# Patient Record
Sex: Female | Born: 1985 | Race: White | Hispanic: No | Marital: Married | Smoking: Never smoker
Health system: Southern US, Community
[De-identification: ages and names within clinical notes are randomized; demographics above are authoritative.]

---

## 2005-04-03 ENCOUNTER — Emergency Department (HOSPITAL_COMMUNITY): Admission: EM | Admit: 2005-04-03 | Discharge: 2005-04-03 | Payer: Self-pay | Admitting: Family Medicine

## 2006-10-26 ENCOUNTER — Emergency Department (HOSPITAL_COMMUNITY): Admission: EM | Admit: 2006-10-26 | Discharge: 2006-10-26 | Payer: Self-pay | Admitting: Family Medicine

## 2006-12-04 ENCOUNTER — Other Ambulatory Visit: Admission: RE | Admit: 2006-12-04 | Discharge: 2006-12-04 | Payer: Self-pay | Admitting: Gynecology

## 2007-05-23 ENCOUNTER — Emergency Department (HOSPITAL_COMMUNITY): Admission: EM | Admit: 2007-05-23 | Discharge: 2007-05-23 | Payer: Self-pay | Admitting: *Deleted

## 2007-06-22 ENCOUNTER — Emergency Department (HOSPITAL_COMMUNITY): Admission: EM | Admit: 2007-06-22 | Discharge: 2007-06-22 | Payer: Self-pay | Admitting: Family Medicine

## 2007-12-30 ENCOUNTER — Ambulatory Visit: Payer: Self-pay | Admitting: Family Medicine

## 2007-12-30 ENCOUNTER — Encounter (INDEPENDENT_AMBULATORY_CARE_PROVIDER_SITE_OTHER): Payer: Self-pay | Admitting: Internal Medicine

## 2007-12-30 DIAGNOSIS — R413 Other amnesia: Secondary | ICD-10-CM

## 2008-01-22 ENCOUNTER — Ambulatory Visit: Payer: Self-pay | Admitting: Family Medicine

## 2008-01-22 DIAGNOSIS — H6120 Impacted cerumen, unspecified ear: Secondary | ICD-10-CM

## 2008-06-14 ENCOUNTER — Inpatient Hospital Stay (HOSPITAL_COMMUNITY): Admission: AD | Admit: 2008-06-14 | Discharge: 2008-06-14 | Payer: Self-pay | Admitting: Obstetrics and Gynecology

## 2008-08-06 ENCOUNTER — Ambulatory Visit: Payer: Self-pay | Admitting: Family Medicine

## 2008-08-06 DIAGNOSIS — R21 Rash and other nonspecific skin eruption: Secondary | ICD-10-CM | POA: Insufficient documentation

## 2008-08-11 ENCOUNTER — Telehealth: Payer: Self-pay | Admitting: Family Medicine

## 2008-08-11 LAB — CONVERTED CEMR LAB
Cytomegalovirus Ab-IgG: <0.91 (ref <0.91)
Varicella IgG: 1.65 — ABNORMAL HIGH

## 2008-10-06 ENCOUNTER — Inpatient Hospital Stay (HOSPITAL_COMMUNITY): Admission: AD | Admit: 2008-10-06 | Discharge: 2008-10-07 | Payer: Self-pay | Admitting: Obstetrics & Gynecology

## 2008-10-19 ENCOUNTER — Inpatient Hospital Stay (HOSPITAL_COMMUNITY): Admission: AD | Admit: 2008-10-19 | Discharge: 2008-10-24 | Payer: Self-pay | Admitting: Obstetrics and Gynecology

## 2008-12-06 ENCOUNTER — Emergency Department: Payer: Self-pay | Admitting: Unknown Physician Specialty

## 2009-01-27 IMAGING — CR DG ELBOW COMPLETE 3+V*R*
1 series · 1 of 1 positions shown · non-contrast
Comparison: none

CLINICAL DATA: Fall with right elbow and right forearm pain.
 RIGHT ELBOW ? 4 VIEW:

[view not recorded]
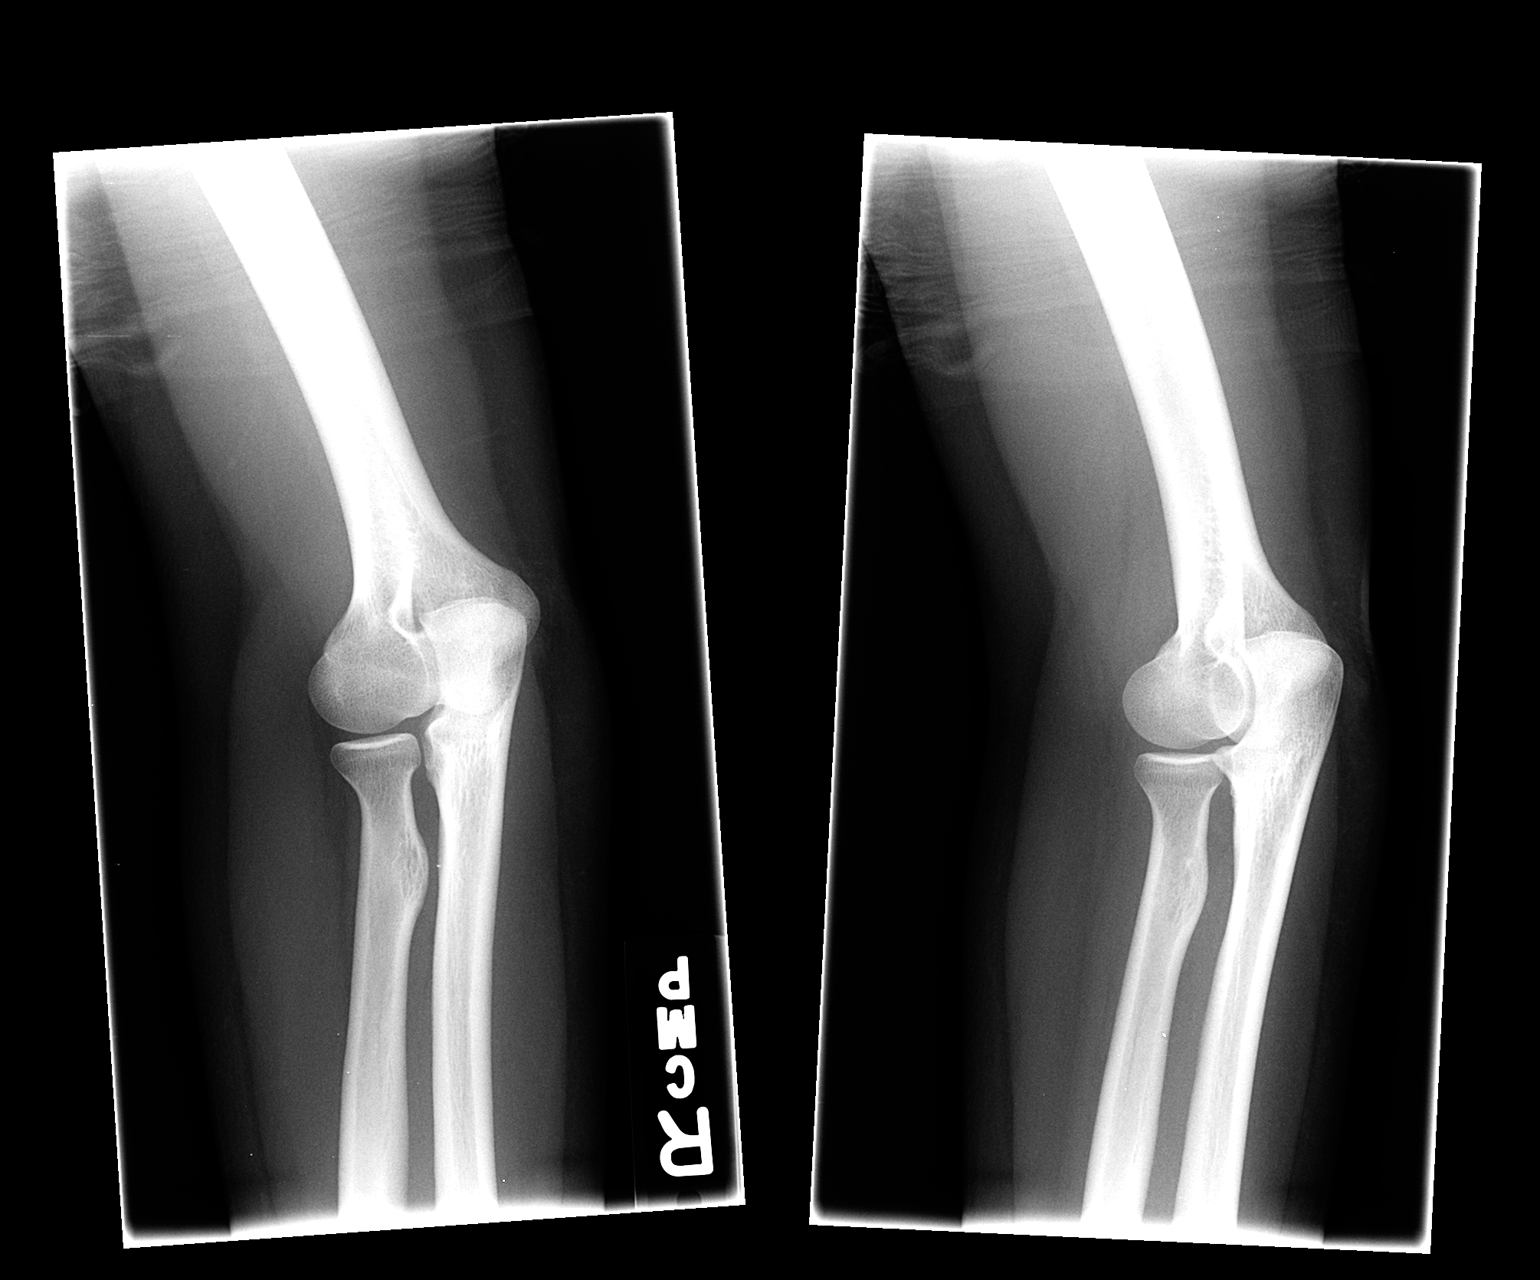

[1 of 1 positions shown; findings below may reference images not displayed]

FINDINGS: There is no evidence of fracture, dislocation, or joint effusion.  There is no evidence of arthropathy or other focal bone abnormality.  Soft tissues are unremarkable.
IMPRESSION: Negative.
 RIGHT FOREARM - 2 VIEW:
FINDINGS: There is no evidence of fracture or other focal bone lesions.  Soft tissues are unremarkable.
IMPRESSION: Negative.

## 2009-02-18 ENCOUNTER — Ambulatory Visit: Payer: Self-pay | Admitting: Family Medicine

## 2009-06-18 ENCOUNTER — Ambulatory Visit: Payer: Self-pay | Admitting: Family Medicine

## 2009-10-15 ENCOUNTER — Ambulatory Visit: Payer: Self-pay | Admitting: Internal Medicine

## 2009-10-15 DIAGNOSIS — L0201 Cutaneous abscess of face: Secondary | ICD-10-CM

## 2009-10-15 DIAGNOSIS — L03211 Cellulitis of face: Secondary | ICD-10-CM

## 2009-10-21 ENCOUNTER — Telehealth (INDEPENDENT_AMBULATORY_CARE_PROVIDER_SITE_OTHER): Payer: Self-pay | Admitting: Internal Medicine

## 2010-01-26 ENCOUNTER — Ambulatory Visit: Payer: Self-pay | Admitting: Family Medicine

## 2010-01-27 ENCOUNTER — Telehealth: Payer: Self-pay | Admitting: Family Medicine

## 2010-06-13 ENCOUNTER — Ambulatory Visit: Payer: Self-pay | Admitting: Family Medicine

## 2010-07-05 ENCOUNTER — Ambulatory Visit: Payer: Self-pay | Admitting: Family Medicine

## 2010-07-05 ENCOUNTER — Other Ambulatory Visit: Admission: RE | Admit: 2010-07-05 | Discharge: 2010-07-05 | Payer: Self-pay | Admitting: Family Medicine

## 2010-07-05 DIAGNOSIS — F329 Major depressive disorder, single episode, unspecified: Secondary | ICD-10-CM

## 2010-07-06 ENCOUNTER — Encounter: Admission: RE | Admit: 2010-07-06 | Discharge: 2010-07-06 | Payer: Self-pay | Admitting: Family Medicine

## 2010-07-08 ENCOUNTER — Telehealth: Payer: Self-pay | Admitting: Family Medicine

## 2010-07-11 ENCOUNTER — Encounter: Payer: Self-pay | Admitting: Family Medicine

## 2010-07-13 ENCOUNTER — Telehealth: Payer: Self-pay | Admitting: Family Medicine

## 2010-08-08 ENCOUNTER — Ambulatory Visit: Payer: Self-pay | Admitting: Family Medicine

## 2010-08-08 DIAGNOSIS — IMO0002 Reserved for concepts with insufficient information to code with codable children: Secondary | ICD-10-CM | POA: Insufficient documentation

## 2011-01-17 NOTE — Assessment & Plan Note (Signed)
Summary: throwing up/dlo   Vital Signs:  Patient profile:   25 year old female Weight:      135 pounds Temp:     100.2 degrees F oral Pulse rate:   104 / minute Pulse rhythm:   regular BP sitting:   120 / 70  (left arm) Cuff size:   regular  Vitals Entered By: [name] LPN (January 26, 2010 2:41 PM) CC: Has been throwing up and has diarrhea, able to tolerate liquids and jello   History of Present Illness: Pt here with Adriana Cain, nauseated and vomiting since this AM. 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 Adriana Cain was off yesterday. Adriana Cain went to [facility].  No cough, ST, rhinitis but has had scratchy throat.   Problems Prior to Update: 1)  Cellulitis, Face, Right  (ICD-682.0) 2)  Otitis Media, Acute, Right  (ICD-382.9) 3)  Viral Gastroenteritis  (ICD-008.8) 4)  Skin Rash  (ICD-782.1) 5)  Cerumen Impaction, Right  (ICD-380.4) 6)  Memory Loss  (ICD-780.93) 7)  Family Planning  (ICD-V25.09)  Medications Prior to Update: 1)  Prozac Liquid .... Pt. Not Sure of Mg. Takes One Teaspoon Daily 2)  Septra Ds 800-160 Mg Tabs (Sulfamethoxazole-Trimethoprim) .... Take 1 Two Times A Day 3)  Bactroban 2 % Crea (Mupirocin Calcium) .... Apply Two Times A Day To Skin Lesion 4)  Iud  Allergies: 1)  ! Amoxicillin  Physical Exam  General:  alert, well-developed, well-nourished, and well-hydrated, slightly dizzy with sitting up..   Head:  Normocephalic and atraumatic without obvious abnormalities. No apparent alopecia or balding. Sinuses NT. Eyes:  Conjunctiva clear bilaterally.  Ears:  R TM dull--no landmarks, not bulging, erythematous L TM dull to LR Nose:  no airflow obstruction, mucosal erythema, and mucosal edema.  sinuses +,- Mouth:  no exudates and pharyngeal erythema.   Lungs:  moist cough only, no crackles and no wheezes.   Abdomen:  Bowel sounds positive,abdomen soft and non-tender without masses, organomegaly or hernias noted.   Impression & Recommendations:  Problem  # 1:  OTITIS MEDIA, ACUTE, RIGHT (ICD-382.9) Assessment Unchanged  Recurrent, see instructuions. The following medications were removed from the medication list:    Septra Ds 800-160 Mg Tabs (Sulfamethoxazole-trimethoprim) .Marland Kitchen... Take 1 two times a day Her updated medication list for this problem includes:    Zithromax 200 Mg/66ml Susr (Azithromycin) .Marland Kitchen... As dir  Instructed on prevention and treatment. Call if no improvement in 48-72 hours or sooner if worsening symptoms.   Problem # 2:  GASTROENTERITIS (ICD-558.9) Assessment: New See instructions.  Complete Medication List: 1)  Iud  2)  Zithromax 200 Mg/88ml Susr (Azithromycin) .... As dir 3)  Promethazine Hcl 6.25 Mg/30ml Syrp (Promethazine hcl) .... One tbsp by mouth every 6 hrsw as needed nausea.  Patient Instructions: 1)  Clear liqs today. 2)  BRAT tomm. 3)  Avoid milk and milk prods for one week. 4)  Take Zithro. 5)  RTC here if sxs don't improve. Prescriptions: PROMETHAZINE HCL 6.25 MG/5ML SYRP (PROMETHAZINE HCL) one tbsp by mouth every 6 hrsw as needed nausea.  #1 bottle x 0   Entered and Authorized by:   Shaune Leeks MD   Signed by:   Shaune Leeks MD on 01/26/2010   Method used:   Electronically to        [facility] (retail)       [address]       [place]  [place], [place]  [address]       Ph: [phone]       Fax: [phone]   RxID:   [id number] 200 MG/5ML SUSR (AZITHROMYCIN) as dir  #1 bottle x 0   Entered and Authorized by:   Shaune Leeks MD   Signed by:   Shaune Leeks MD on 01/26/2010   Method used:   Electronically to        [facility] (retail)       [address]       [place], [place]  [address]       Ph: [phone]       Fax: [phone]   RxID:   [id number]   Current Allergies (reviewed today): ! AMOXICILLIN

## 2011-01-17 NOTE — Progress Notes (Signed)
Summary: reaction to generic wellbutrin  Phone Note Call from Patient Call back at Home Phone [phone]   Caller: Spouse Call For: Ruthe Mannan MD Summary of Call: Pt started generic wellbutrin yesterday.  By yesterday afternoon she was itching really badly and having bad mood swings.  Husband is asking if she can change to something else.  Uses [facility]. Initial call taken by: Lowella Petties CMA,  July 08, 2010 8:34 AM  Follow-up for Phone Call        We can restart her prozac.  Additional Follow-up for Phone Call Additional follow up Details #1::        Advised pt's husband.             Lowella Petties CMA  July 08, 2010 10:36 AM     New/Updated Medications: PROZAC 20 MG CAPS (FLUOXETINE HCL) 1 tab by mouth daily. Prescriptions: PROZAC 20 MG CAPS (FLUOXETINE HCL) 1 tab by mouth daily.  #30 x 0   Entered and Authorized by:   Ruthe Mannan MD   Signed by:   Ruthe Mannan MD on 07/08/2010   Method used:   Electronically to        [facility] (retail)       [address]       [place], [place]  [address]       Ph: [phone]       Fax: [phone]   RxID:   [id number]

## 2011-01-17 NOTE — Assessment & Plan Note (Signed)
Summary: ONE MONTH FOLLOW UP / LFW   Vital Signs:  Patient profile:   25 year old female Height:      61 inches Weight:      123.50 pounds BMI:     23.42 Temp:     98.3 degrees F oral Pulse rate:   64 / minute Pulse rhythm:   regular BP sitting:   102 / 70  (left arm) Cuff size:   regular  Vitals Entered By: Linde Gillis CMA Duncan Dull) (August 08, 2010 9:38 AM) CC: 1 month follow up   History of Present Illness: 25 yo female new to me here to follow up depression and painful intercourse. 1.  Painful intercourse- has been an issue for over two years.  Was conerned it was her IUD but we verified appropriate intrauterine position. Started her on Prozac last month, sex is now less painful.  2.  Depression- started Prozac last month.  Doing well.   Could not tolerate Wellbutrin, prefers prozac.  Mood is better.  Husband agrees that she is less tearful and more motivated to do things.  No SI or HI.  Current Medications (verified): 1)  Iud 2)  Prozac 20 Mg Caps (Fluoxetine Hcl) .Marland Kitchen.. 1 Tab By Mouth Daily. 3)  Fluoxetine Hcl 20 Mg/26ml Soln (Fluoxetine Hcl) .... 5 Ml By Mouth Daily Dispense Qs  Allergies: 1)  ! Amoxicillin  Past History:  Family History: Last updated: 12/30/2007 Father: 51--COPD, hx of ETOH abuse, stomach cancer Mother: 45--Hx of ETOH abuse,  Siblings: 1 br--L&W                1 half br--ETOH abuse                DM-MGM MI-Paunt,  CVA- MGF Prostate Cancer-0 Breast Cancer-0 Ovarian Cancer-0 Uterine Cancer-0 Colon Cancer-grand parent Drug/ ETOH Abuse-fathers side, some on mothers side Depression-   Social History: Last updated: 12/30/2007 Marital Status: Married 9/08 Children: 0 Occupation: diet aid at [employer]  Risk Factors: Exercise: no (12/30/2007)  Risk Factors: Smoking Status: never (12/30/2007) Passive Smoke Exposure: yes (12/30/2007)  Review of Systems      See HPI Psych:  Denies mental problems, panic attacks, sense of great danger,  suicidal thoughts/plans, thoughts of violence, unusual visions or sounds, and thoughts /plans of harming others.  Physical Exam  General:  Alert, well-developed, well-nourished, and well-hydrated.   Psych:  Cognition and judgment appear intact. Alert and cooperative with normal attention span and concentration. No apparent delusions, illusions, hallucinations   Impression & Recommendations:  Problem # 1:  DYSPAREUNIA (ICD-625.0) Assessment Improved Improved with Prozac.  See below.  Problem # 2:  DEPRESSION (ICD-311) Assessment: Improved Time spent with patient 25 minutes, more than 50% of this time was spent counseling patient on depression and dyspareunia.  Continue Prozac at current dose.  The following medications were removed from the medication list:    Prozac 20 Mg Caps (Fluoxetine hcl) .Marland Kitchen... 1 tab by mouth daily. Her updated medication list for this problem includes:    Fluoxetine Hcl 20 Mg/39ml Soln (Fluoxetine hcl) .[facility] KitchenMarland Kitchen 5 ml by mouth daily dispense qs  Complete Medication List: 1)  Iud  2)  Fluoxetine Hcl 20 Mg/92ml Soln (Fluoxetine hcl) .... 5 ml by mouth daily dispense qs Prescriptions: FLUOXETINE HCL 20 MG/5ML SOLN (FLUOXETINE HCL) 5 ml by mouth daily dispense qs  #1 x 11   Entered and Authorized by:   Ruthe Mannan MD   Signed by:  Ruthe Mannan MD on 08/08/2010   Method used:   Electronically to        [facility] (retail)       [address]       [place], [place]  [address]       Ph: [phone]       Fax: [phone]   RxID:   [id number]   Current Allergies (reviewed today): ! AMOXICILLIN

## 2011-01-17 NOTE — Progress Notes (Signed)
Summary: scripts need clarification  Phone Note From Pharmacy   [facility] Summary of Call: Pharmacy needs clarification on the meds that were sent in yesterday, they need the quantity on the phenergan and the directions on the zithromax. Initial call taken by: Lowella Petties CMA,  January 27, 2010 8:26 AM  Follow-up for Phone Call        10 Phenergan Zithro 2tsp to starrt then 1 tsp daily until gone. Follow-up by: Shaune Leeks MD,  January 27, 2010 8:36 AM  Additional Follow-up for Phone Call Additional follow up Details #1::        Called to [facility]. Additional Follow-up by: Lowella Petties CMA,  January 27, 2010 11:55 AM

## 2011-01-17 NOTE — Progress Notes (Signed)
Summary: wants liquid prozac  Phone Note Call from Patient Call back at Home Phone [phone]   Caller: Spouse Summary of Call: Pt is taking prozac capsules but husband is asking if she can get this in a liquid form instead.  Pt has difficulty swallowing tablets and capsules. Uses [facility]. Initial call taken by: Lowella Petties CMA,  July 13, 2010 10:10 AM  Follow-up for Phone Call        Lowella Bandy, can you please call her pharmacy to see if they have this in liquid. Thanks, Linnie Delgrande  Yes it does come in liquid form.  20mg /43ml per pharmacist at [facility].  Linde Gillis CMA Duncan Dull)  July 13, 2010 11:21 AM   Additional Follow-up for Phone Call Additional follow up Details #1::        Call in 5ml (20mg /23ml) dose daily, dispense quantity sufficient with 1 refill.  Ruthe Mannan, MD 07/13/2010 11:36AM  Rx called to pharmacy, added to medication list. Additional Follow-up by: Linde Gillis CMA Duncan Dull),  July 13, 2010 11:37 AM

## 2011-01-17 NOTE — Assessment & Plan Note (Signed)
Summary: new pt to estabh from [facility].  pt husband see's dr Dayton Martes and w...   Vital Signs:  Patient profile:   25 year old female Height:      61 inches Weight:      128.25 pounds Temp:     98.5 degrees F Pulse rate:   76 / minute Pulse rhythm:   regular BP sitting:   108 / 72  (left arm) Cuff size:   regular  Vitals Entered By: Janee Morn CMA (July 05, 2010 11:11 AM) CC: New pt establish care   History of Present Illness: 25 yo female new to me here to discuss two issues.  1.  Painful intercourse- has had IUD for two years.  Periods have been very light but over past year, feels pain internally when she has sex.  She feels like huband is hitting her IUD. Cannot remember the name of her OB who inserted it.  No fevers, nausea, vomiting.  2.  Depression/inattention- has been on Adderall and Ritilin in past.  Was recently on Prozaac and would like to start that again.  Has h/o FAS.  Overall doing well, but cannot concentrate at work.  Denies anxiety, SI or HI.  Current Medications (verified): 1)  Iud 2)  Wellbutrin 75 Mg Tabs (Bupropion Hcl) .... 2 Tabs By Mouth Every Morning  Allergies: 1)  ! Amoxicillin  Past History:  Family History: Last updated: 12/30/2007 Father: 51--COPD, hx of ETOH abuse, stomach cancer Mother: 45--Hx of ETOH abuse,  Siblings: 1 br--L&W                1 half br--ETOH abuse                DM-MGM MI-Paunt,  CVA- MGF Prostate Cancer-0 Breast Cancer-0 Ovarian Cancer-0 Uterine Cancer-0 Colon Cancer-grand parent Drug/ ETOH Abuse-fathers side, some on mothers side Depression-   Social History: Last updated: 12/30/2007 Marital Status: Married 9/08 Children: 0 Occupation: diet aid at [employer]  Risk Factors: Exercise: no (12/30/2007)  Risk Factors: Smoking Status: never (12/30/2007) Passive Smoke Exposure: yes (12/30/2007)  Review of Systems      See HPI GI:  Denies abdominal pain, nausea, and vomiting. GU:  Denies abnormal vaginal  bleeding, discharge, and dysuria.  Physical Exam  General:  Alert, well-developed, well-nourished, and well-hydrated.   Genitalia:  normal introitus, no external lesions, and no vaginal or cervical lesions.   IUD strings in place no cervical motion tenderness Extremities:  no edema Psych:  Cognition and judgment appear intact. Alert and cooperative with normal attention span and concentration. No apparent delusions, illusions, hallucinations   Impression & Recommendations:  Problem # 1:  INTRAUTERINE CONTRACEPTIVE DEVICE SURVEILLANCE (ICD-V25.42) Will get ultrasound to evaluate placement. Pap performed today as well. She had no discomfort with exam.  ?possibility of IUD pressing against uterine wall although no abdominal pain without intercourse. Orders: Radiology Referral (Radiology)  Problem # 2:  DEPRESSION (ICD-311) Assessment: Unchanged Time spent with patient 25 minutes, more than 50% of this time was spent counseling patient on depression and inattention. I explained that prozaac is not my first choice given her symptoms. I would prefer Wellbutrin to help with attention and minmize sexual dyfunction. IR tablets ordered so she can crush themt(she cannot swallow pills). Follow up in one month.  Her updated medication list for this problem includes:    Wellbutrin 75 Mg Tabs (Bupropion hcl) .Marland Kitchen... 2 tabs by mouth every morning  Complete Medication List: 1)  Iud  2)  Wellbutrin 75 Mg Tabs (Bupropion hcl) .... 2 tabs by mouth every morning  Patient Instructions: 1)  Please stop by to see Shirlee Limerick on your way out.  Prescriptions: WELLBUTRIN 75 MG TABS (BUPROPION HCL) 2 tabs by mouth every morning  #60 x 0   Entered and Authorized by:   Ruthe Mannan MD   Signed by:   Ruthe Mannan MD on 07/05/2010   Method used:   Electronically to        [facility] (retail)       [address]       [place], [place]  [address]       Ph: [phone]       Fax:  [phone]   RxID:   [id number]   Current Allergies (reviewed today): ! AMOXICILLIN

## 2011-01-17 NOTE — Letter (Signed)
Summary: Out of Work  [facility] at [facility]  [address] [place], [place] [address]   Phone: [phone]  Fax: [phone]    January 26, 2010   Employee:  Adriana Cain    To Whom It May Concern:   For Medical reasons, please excuse the above named employee from work for the following dates:  Start:   01/26/10  End:   01/28/10  If you need additional information, please feel free to contact our office.         Sincerely,    Shaune Leeks MD

## 2011-01-17 NOTE — Assessment & Plan Note (Signed)
Summary: sore throat/alc acute only   Vital Signs:  Patient profile:   25 year old female Weight:      128.50 pounds Temp:     98.8 degrees F oral Pulse rate:   84 / minute Pulse rhythm:   regular BP sitting:   94 / 60  (left arm) Cuff size:   regular  Vitals Entered By: [name] LPN (June 13, 2010 3:42 PM) CC: Non-productive cough, head congestion and sore throat   History of Present Illness: Pt here for bad ST since Sat. She has occas chills, no fever, no headache, no ear pain, mild rhinitis that is clear, signif ST as above, some cough, minimally poroductive of clear mucous, mild SOB, no N/V, no achiness. She has taken Tyl severe colde.    Problems Prior to Update: 1)  Cellulitis, Face, Right  (ICD-682.0) 2)  Skin Rash  (ICD-782.1) 3)  Cerumen Impaction, Right  (ICD-380.4) 4)  Memory Loss  (ICD-780.93) 5)  Family Planning  (ICD-V25.09)  Medications Prior to Update: 1)  Iud 2)  Zithromax 200 Mg/65ml Susr (Azithromycin) .... As Dir 3)  Promethazine Hcl 6.25 Mg/38ml Syrp (Promethazine Hcl) .... One Tbsp By Mouth Every 6 Hrsw As Needed Nausea.  Allergies: 1)  ! Amoxicillin  Physical Exam  General:  Alert, well-developed, well-nourished, and well-hydrated.   Head:  Normocephalic and atraumatic without obvious abnormalities. No apparent alopecia or balding. Sinuses NT. Eyes:  Conjunctiva clear bilaterally.  Ears:  R TM erythem and distorted, L TM ok. Nose:  External nasal examination shows no deformity or inflammation. Nasal mucosa are pink and moist without lesions or exudates. Mouth:  no exudate but mild  pharyngeal erythema w/o exudate.   Neck:  No deformities or masses but swollen ant cerv glands with  tenderness noted bilat, shoody and mobile. Chest Wall:  No deformities, masses, or tenderness noted. Lungs:  Normal respiratory effort, chest expands symmetrically. Lungs are clear to auscultation, no crackles or wheezes. Heart:  Normal rate and regular rhythm. S1  and S2 normal without gallop, murmur, click, rub or other extra sounds.   Impression & Recommendations:  Problem # 1:  OTITIS MEDIA, PURULENT, ACUTE (ICD-382.00)  Right sided, see instructions. Her updated medication list for this problem includes:    Zithromax 200 Mg/100ml Susr (Azithromycin) .Marland Kitchen... As dir    Bactrim 400-80 Mg Tabs (Sulfamethoxazole-trimethoprim)  Instructed on prevention and treatment. Call if no improvement in 48-72 hours or sooner if worsening symptoms.   Problem # 2:  URI (ICD-465.9) Assessment: New  See instructions. Her updated medication list for this problem includes:    Promethazine Hcl 6.25 Mg/103ml Syrp (Promethazine hcl) ..... One tbsp by mouth every 6 hrsw as needed nausea.  Instructed on symptomatic treatment. Call if symptoms persist or worsen.   Complete Medication List: 1)  Iud  2)  Zithromax 200 Mg/26ml Susr (Azithromycin) .... As dir 3)  Promethazine Hcl 6.25 Mg/31ml Syrp (Promethazine hcl) .... One tbsp by mouth every 6 hrsw as needed nausea. 4)  Bactrim 400-80 Mg Tabs (Sulfamethoxazole-trimethoprim)  Patient Instructions: 1)  Take Septra/Bactrim susp two times a day for 10 days. 2)  Take Guaifenesin by going to [facility] or [facility] and getting MUCOUS RELIEF EXPECTORANT (400mg ), take 11/2 tabs by mouth AM and NOON. 3)  Drink lots of fluids anytime taking Guaifenesin.  4)  Tyl as needed. 5)  Gargle every 1/2 hr with warm salt water for two days.  Current Allergies (reviewed today): !  AMOXICILLIN

## 2011-01-17 NOTE — Letter (Signed)
Summary: Results Follow up Letter  [place] at [facility]  [address] [place], [place] [address]   Phone: [phone]  Fax: [phone]    07/11/2010 MRN: [id number]  Adriana Cain [address] [place], [place]  [address]  Dear [name],  The following are the results of your recent test(s):  Test         Result    Pap Smear:        Normal __X___  Not Normal _____ Comments: ______________________________________________________ Cholesterol: LDL(Bad cholesterol):         Your goal is less than:         HDL (Good cholesterol):       Your goal is more than: Comments:  ______________________________________________________ Mammogram:        Normal _____  Not Normal _____ Comments:  ___________________________________________________________________ Hemoccult:        Normal _____  Not normal _______ Comments:    _____________________________________________________________________ Other Tests:    We routinely do not discuss normal results over the telephone.  If you desire a copy of the results, or you have any questions about this information we can discuss them at your next office visit.   Sincerely,       Ruthe Mannan, MD

## 2011-03-15 ENCOUNTER — Ambulatory Visit (INDEPENDENT_AMBULATORY_CARE_PROVIDER_SITE_OTHER): Payer: BC Managed Care – PPO | Admitting: Family Medicine

## 2011-03-15 ENCOUNTER — Encounter: Payer: Self-pay | Admitting: *Deleted

## 2011-03-15 ENCOUNTER — Encounter: Payer: Self-pay | Admitting: Family Medicine

## 2011-03-15 VITALS — BP 100/76 | HR 80 | Temp 97.7°F | Ht 61.0 in | Wt 121.8 lb

## 2011-03-15 DIAGNOSIS — K5289 Other specified noninfective gastroenteritis and colitis: Secondary | ICD-10-CM

## 2011-03-15 DIAGNOSIS — K529 Noninfective gastroenteritis and colitis, unspecified: Secondary | ICD-10-CM

## 2011-03-15 DIAGNOSIS — R110 Nausea: Secondary | ICD-10-CM

## 2011-03-15 MED ORDER — PROMETHAZINE HCL 25 MG/ML IJ SOLN: 25.0000 mg | INTRAMUSCULAR | Status: DC

## 2011-03-15 MED ORDER — PROMETHAZINE HCL 6.25 MG/5ML PO SYRP: 12.5000 mg | ORAL_SOLUTION | Freq: Four times a day (QID) | ORAL | Status: DC | PRN

## 2011-03-15 MED ORDER — PROMETHAZINE HCL 25 MG PO TABS: 25.0000 mg | ORAL_TABLET | Freq: Four times a day (QID) | ORAL | Status: DC | PRN

## 2011-03-15 NOTE — Progress Notes (Signed)
  Subjective:     Adriana Cain is a 25 y.o. female who presents for evaluation of nonbilious vomiting 4 times per day. Symptoms have been present for 1 day. Patient denies fever. Patient's oral intake has been decreased for solids. Patient's urine output has been adequate. Other contacts with similar symptoms include: husband and daughter. Patient denies recent travel history. Patient has not had recent ingestion of possible contaminated food, toxic plants, or inappropriate medications/poisons.   The following portions of the patient's history were reviewed and updated as appropriate: problem list.  Review of Systems Pertinent items are noted in HPI.    Objective:     BP 100/76  Pulse 80  Temp(Src) 97.7 F (36.5 C) (Oral)  Ht 5\' 1"  (1.549 m)  Wt 121 lb 12.8 oz (55.248 kg)  BMI 23.01 kg/m2  General Appearance:    Alert, cooperative, no distress, appears stated age, holding trash can.  Head:  Normocephalic  Throat:   Lips, mucosa, and tongue normal; teeth and gums normal  Neck:   Supple, symmetrical, trachea midline, no adenopathy;    thyroid:  no enlargement/tenderness/nodules; no carotid   bruit or JVD  Back:     Symmetric, no curvature, ROM normal, no CVA tenderness  Abdomen:     Soft, non-tender, bowel sounds active all four quadrants,    no masses, no organomegaly  Extremities:   Extremities normal, atraumatic, no cyanosis or edema  Pulses:   2+ and symmetric all extremities  Skin:   Skin color, texture, turgor normal, no rashes or lesions      Assessment:    Acute Gastroenteritis    Plan:    1. Discussed oral rehydration, reintroduction of solid foods, signs of dehydration. 2. Return or go to emergency department if worsening symptoms, blood or bile, signs of dehydration, diarrhea lasting longer than 5 days or any new concerns. 3. Follow up in 2 weeks or sooner as needed.

## 2011-03-15 NOTE — Patient Instructions (Signed)
  Place gastroenteritis patient instructions here.

## 2011-05-02 NOTE — Op Note (Signed)
NAMEMarland Kitchen  SKYE, PLAMONDON           ACCOUNT NO.:  [id number]   MEDICAL RECORD NO.:  [id number]          PATIENT TYPE:  INP   LOCATION:  9146                          FACILITY:  [facility]   PHYSICIAN:  Zelphia Cairo, MD    DATE OF BIRTH:  06/15/1986   DATE OF PROCEDURE:  10/21/2008  DATE OF DISCHARGE:                               OPERATIVE REPORT   PREOPERATIVE DIAGNOSES:  1. Intrauterine pregnancy at term.  2. Failure to progress in labor.   PROCEDURE:  Primary low-transverse cesarean section.   SURGEON:  Zelphia Cairo, MD.   ESTIMATED BLOOD LOSS:  1000 mL.   URINE OUTPUT:  100 mL.   FINDINGS:  Viable female infant with Apgars of 9 and 9, weight 7 pounds  5 ounces.   COMPLICATIONS:  None.   CONDITION:  Stable to recovery room.   PROCEDURE:  The patient was taken to the operating room where epidural  anesthesia was found to be adequate.  She was prepped and draped in  sterile fashion.  Pfannenstiel skin incision was made with a scalpel and  carried down to the underlying fascia.  The fascia was incised in the  midline.  This was extended laterally using Mayo scissors.  Kocher  clamps were used to grasp the superior portion of the fascia.  This was  tented upwards and the underlying rectus muscles were dissected off  using the Bovie.  Our attention was then turned to the inferior portion.  The fascia was tented upwards with Kocher clamps and the underlying  rectus muscles were dissected off using the Bovie.  Peritoneum was then  identified, tented upwards, and entered sharply with Metzenbaum  scissors.  This was extended superiorly and inferiorly with good  visualization of the bladder.  Bladder blade was then inserted.  Vesicouterine peritoneum was tented upwards and entered sharply with  Metzenbaum scissors.  This was dissected off of the lower uterine  segment.  Bladder blade was then reinserted to protect the bladder flap.   Uterine incision was then made with a scalpel  and this incision was  extended laterally using bandage scissors.  Fetal vertex was brought to  the uterine incision and the mouth and nose were suctioned.  Shoulders  and body easily followed.  Using fundal pressure, the cord was clamped  and cut, and the infant was taken to the awaiting pediatric staff.  The  placenta was then manually removed from the placenta and the placenta  was handed off for disposal.  Uterus was then exteriorized from pelvis.  The uterus was cleared of all clots and debris using a dry lap sponge.  Uterine incision was closed using a double layer closure of 0-chromic in  a running locked fashion.  Once hemostasis was assured, the uterus was  placed back into the pelvic cavity and the pelvis was copiously  irrigated with warm  normal saline.  The uterine incision was reinspected and found to be  hemostatic.  Peritoneum was closed with 0-Monocryl.  The fascia was  closed with a looped 0-PDS and the skin was closed with staples.  The  patient tolerated the  procedure well.  Sponge, lap, needle, and  instrument counts were correct x2.      Zelphia Cairo, MD  Electronically Signed     GA/MEDQ  D:  10/21/2008  T:  10/22/2008  Job:  [id number]

## 2011-05-05 NOTE — Discharge Summary (Signed)
NAMEMarland Kitchen  Adriana Cain, Adriana Cain           ACCOUNT NO.:  [id number]   MEDICAL RECORD NO.:  [id number]          PATIENT TYPE:  INP   LOCATION:  9129                          FACILITY:  [facility]   PHYSICIAN:  Juluis Mire, M.D.   DATE OF BIRTH:  [birth year]   DATE OF ADMISSION:  10/19/2008  DATE OF DISCHARGE:  10/24/2008                               DISCHARGE SUMMARY   ADMITTING DIAGNOSES:  1. Intrauterine pregnancy at term.  2. Spontaneous onset of labor, latent phase.   DISCHARGE DIAGNOSIS:  1. Status post low transverse cesarean section secondary to failure to      progress.  2. Viable female infant.   PROCEDURE:  Primary low transverse cesarean section.   REASON FOR ADMISSION:  Please see written H&P.   HOSPITAL COURSE:  The patient is a 25 year old white married female  primigravida, who was admitted to [facility] at 40 weeks  estimated gestational age with early latent phase of labor.  Group B  beta strep culture was noted to be negative.  On admission, vital signs  were stable.  Contractions were every 2-3 minutes.  Cervix was dilated  to 1-2 cm, vertex at a -3 station.  The patient was started on IV  Pitocin to augment her labor.  Fetal heart tones were reactive.  The  patient was eventually given epidural for her comfort.  Cervix was now 2  cm, 80% effaced, with vertex at a -2 station.  Fetal heart tones  continued to be reassuring.  Contractions were approximately every 2  minutes.  However, the patient made no further progress beyond 3 cm  completely effaced with a vertex at a -2 station.  Given the patient's  failure to progress adequately, decision was made to proceed with a  primary low transverse cesarean section.  The patient was now  transferred to the operating room, where epidural was dosed to an  adequate surgical level.  A low transverse incision was made with  delivery of a viable female infant weighing 7 pounds and 5 ounces with  Apgars of 9 at 1  minute and 9 at 5 minutes.  The patient tolerated the  procedure well and was taken to the recovery room in stable condition.  On postoperative day #1, the patient was without complaint.  Vital signs  were stable.  She was afebrile.  Abdomen was soft.  Fundus firm and  nontender.  Abdominal dressings were noted to be clean, dry, and intact.  Foley was draining with adequate amounts of urine output.  Laboratory  findings showed hemoglobin of 7.9, platelet count of 165,000, and WBC  count of 10.4.  CBC was ordered for the following morning.  On  postoperative day #2, the patient was without complaint.  She denied  dizziness.  She did complain of some fatigue.  Vital signs were stable.  She was afebrile.  Blood pressure was 106/72 and heart rate was 99-116.  Abdomen was soft.  Fundus firm and nontender.  Abdominal dressing noted  to be clean, dry, and intact.  Laboratory findings revealed a hemoglobin  of 7.3, platelet count of 180,000,  and WBC count of 11.7.  The patient  had been started on some iron supplementation.  She was allowed to  shower.  Abdominal dressing was removed.  On postoperative day #3, the  patient was without complaint.  Vital signs remained stable.  She was  afebrile.  Fundus firm and nontender.  Incision was clean, dry, and  intact.  Staples were removed and the patient was later discharged to  home.   CONDITION ON DISCHARGE:  Stable.   DIET:  Regular as tolerated.   ACTIVITY:  No heavy lifting, no driving x2 weeks, and no vaginal entry.   FOLLOWUP:  The patient is to follow up in the office in 1 week for an  incision check.  She is to call for temperature greater than 100  degrees, persistent nausea, vomiting, heavy vaginal bleeding, and/or  redness or drainage from incisional site.   DISCHARGE MEDICATIONS:  1. Tylox #30 one p.o. every 4-6 hours.  2. Ferrous sulfate 325 mg 1 p.o. b.i.d.  3. Prenatal vitamins 1 p.o. daily.  4. Colace 1 p.o. daily  p.r.n.      Adriana Cain, N.P.      Juluis Mire, M.D.  Electronically Signed    CC/MEDQ  D:  11/24/2008  T:  11/24/2008  Job:  [id number]

## 2011-06-03 ENCOUNTER — Encounter: Payer: Self-pay | Admitting: Family Medicine

## 2011-06-03 ENCOUNTER — Ambulatory Visit (INDEPENDENT_AMBULATORY_CARE_PROVIDER_SITE_OTHER): Payer: BC Managed Care – PPO | Admitting: Family Medicine

## 2011-06-03 VITALS — BP 100/60 | HR 80 | Temp 99.0°F | Wt 122.0 lb

## 2011-06-03 DIAGNOSIS — H6690 Otitis media, unspecified, unspecified ear: Secondary | ICD-10-CM

## 2011-06-03 DIAGNOSIS — H6691 Otitis media, unspecified, right ear: Secondary | ICD-10-CM | POA: Insufficient documentation

## 2011-06-03 MED ORDER — AZITHROMYCIN 100 MG/5ML PO SUSR: ORAL | Status: DC

## 2011-06-03 NOTE — Assessment & Plan Note (Signed)
With viral uri  Pt cannot swallow pills- px zithromax liquid  Update if not imp or worse Disc sympt care

## 2011-06-03 NOTE — Patient Instructions (Signed)
You have a cold and an ear infection on the right  Tylenol / motrin for fever (or day quil because that has tylenol in it )  Drink lots of fluids Take the zithromax as directed  Out of work until Monday

## 2011-06-03 NOTE — Progress Notes (Signed)
Subjective:    Patient ID: Adriana Cain, female    DOB: Oct 07, 1986, 25 y.o.   MRN: [id number]  HPI Started getting sick 3 days ago  Out of work  Started as a sore throat and runny/ stuffy nose  Then cough  Pressure feeling in ears  Then achiness  99 - low grade temp on and off Taking dayquil - during the day  Nothing at night  Is able to sleep  Cough is really dry   No sick exposures known  Not a smoker  No asthma   Patient Active Problem List  Diagnoses  . DEPRESSION  . CERUMEN IMPACTION, RIGHT  . DYSPAREUNIA  . CELLULITIS, FACE, RIGHT  . MEMORY LOSS  . SKIN RASH  . Right otitis media   No past medical history on file. No past surgical history on file. History  Substance Use Topics  . Smoking status: Never Smoker   . Smokeless tobacco: Not on file  . Alcohol Use: Not on file   Family History  Problem Relation Age of Onset  . Alcohol abuse Mother   . Alcohol abuse Father   . Cancer Father     stomach  . COPD Father   . Diabetes Maternal Grandmother   . Alcohol abuse Brother    Allergies  Allergen Reactions  . Amoxicillin     REACTION: as child   Current Outpatient Prescriptions on File Prior to Visit  Medication Sig Dispense Refill  . FLUoxetine (PROZAC) 20 MG/5ML solution Take 5 mg by mouth daily.        Marland Kitchen levonorgestrel (MIRENA) 20 MCG/24HR IUD 1 each by Intrauterine route once.         Current Facility-Administered Medications on File Prior to Visit  Medication Dose Route Frequency Provider Last Rate Last Dose  . promethazine (PHENERGAN) injection 25 mg  25 mg Intramuscular 1 dose over 24 hours Ruthe Mannan, MD         Review of Systems Review of Systems  Constitutional: Negative for fever, appetite change,  and unexpected weight change. pos for fatigue  Eyes: Negative for pain and visual disturbance.  ENT pos for congestion and st and ear discomfort Respiratory: pos for mild cough   Cardiovascular: Negative.for cp   Gastrointestinal:  Negative for nausea, diarrhea and constipation.  Genitourinary: Negative for urgency and frequency.  Skin: Negative for pallor. and rash Neurological: Negative for weakness, light-headedness, numbness and headaches.  Hematological: Negative for adenopathy. Does not bruise/bleed easily.  Psychiatric/Behavioral: Negative for dysphoric mood. The patient is not nervous/anxious.          Objective:   Physical Exam  Constitutional: She appears well-developed and well-nourished.  HENT:  Head: Normocephalic and atraumatic.  Left Ear: External ear normal.  Mouth/Throat: Oropharynx is clear and moist.       Nares cong and injected  R TM erythema with effusion- no bulging  Eyes: Conjunctivae and EOM are normal. Pupils are equal, round, and reactive to light. Right eye exhibits no discharge. Left eye exhibits no discharge.  Neck: Normal range of motion. Neck supple. No thyromegaly present.  Cardiovascular: Normal rate, regular rhythm and normal heart sounds.   Pulmonary/Chest: Effort normal and breath sounds normal. No respiratory distress. She has no wheezes. She has no rales.  Lymphadenopathy:    She has no cervical adenopathy.  Skin: Skin is warm and dry. No rash noted.  Psychiatric: She has a normal mood and affect.  Assessment & Plan:

## 2011-08-17 ENCOUNTER — Encounter: Payer: Self-pay | Admitting: *Deleted

## 2011-08-17 ENCOUNTER — Encounter: Payer: Self-pay | Admitting: Family Medicine

## 2011-08-17 ENCOUNTER — Ambulatory Visit (INDEPENDENT_AMBULATORY_CARE_PROVIDER_SITE_OTHER): Payer: BC Managed Care – PPO | Admitting: Family Medicine

## 2011-08-17 VITALS — BP 102/60 | HR 90 | Temp 98.5°F | Wt 118.0 lb

## 2011-08-17 DIAGNOSIS — J069 Acute upper respiratory infection, unspecified: Secondary | ICD-10-CM

## 2011-08-17 DIAGNOSIS — J029 Acute pharyngitis, unspecified: Secondary | ICD-10-CM

## 2011-08-17 NOTE — Progress Notes (Signed)
SUBJECTIVE:  Adriana Cain is a 25 y.o. female who complains of congestion, sore throat and dry cough for 2 days. She denies a history of anorexia, chest pain, nausea and shortness of breath and denies a history of asthma. Patient denies smoke cigarettes.   OBJECTIVE: BP 102/60  Pulse 90  Temp(Src) 98.5 F (36.9 C) (Oral)  Wt 118 lb (53.524 kg)  She appears well, vital signs are as noted. Ears normal.  Throat and pharynx normal.  Neck supple. No adenopathy in the neck. Nose is congested. Sinuses non tender. The chest is clear, without wheezes or rales.  ASSESSMENT:  viral upper respiratory illness  PLAN: Symptomatic therapy suggested: push fluids, rest and return office visit prn if symptoms persist or worsen. Lack of antibiotic effectiveness discussed with her. Call or return to clinic prn if these symptoms worsen or fail to improve as anticipated.

## 2011-09-19 LAB — CBC
HCT: 21.1 — ABNORMAL LOW
HCT: 23.1 — ABNORMAL LOW
Hemoglobin: 11.7 — ABNORMAL LOW
Hemoglobin: 7.3 — CL
MCHC: 33.9
MCHC: 34
MCHC: 34.4
MCV: 86.1
MCV: 86.8
Platelets: 180
WBC: 10.4
WBC: 11.7 — ABNORMAL HIGH

## 2011-10-03 ENCOUNTER — Other Ambulatory Visit: Payer: Self-pay | Admitting: Family Medicine

## 2011-10-27 ENCOUNTER — Encounter: Payer: Self-pay | Admitting: Family Medicine

## 2011-10-27 ENCOUNTER — Encounter: Payer: Self-pay | Admitting: *Deleted

## 2011-10-27 ENCOUNTER — Ambulatory Visit (INDEPENDENT_AMBULATORY_CARE_PROVIDER_SITE_OTHER): Payer: BC Managed Care – PPO | Admitting: Family Medicine

## 2011-10-27 VITALS — BP 100/70 | HR 88 | Temp 98.6°F | Ht 61.0 in | Wt 121.2 lb

## 2011-10-27 DIAGNOSIS — L0201 Cutaneous abscess of face: Secondary | ICD-10-CM

## 2011-10-27 DIAGNOSIS — L03211 Cellulitis of face: Secondary | ICD-10-CM

## 2011-10-27 MED ORDER — TRIAMCINOLONE ACETONIDE 0.025 % EX OINT: TOPICAL_OINTMENT | Freq: Two times a day (BID) | CUTANEOUS | Status: AC

## 2011-10-27 MED ORDER — SULFAMETHOXAZOLE-TRIMETHOPRIM 200-40 MG/5ML PO SUSP: 20.0000 mL | Freq: Two times a day (BID) | ORAL | Status: DC

## 2011-10-27 NOTE — Patient Instructions (Signed)
Good to see you. You can apply the triamcinolone (steroid cream) to the area twice daily (for no longer than 2 weeks). Take antibiotic as directed. Call me on Monday if no improvement.

## 2011-10-27 NOTE — Progress Notes (Signed)
  Subjective:    Patient ID: Adriana Cain, female    DOB: Oct 15, 1986, 25 y.o.   MRN: [id number]  HPI  25 yo here for ?cellulitis under her left eye.  Thinks she scratched her face. Works in a nursing home.  Last few days, increased erythema and warmth around it. Not draining but it is itchy.  Does have h/o cellulitis in past (unknown if MRSA).  No fevers, nausea or vomiting.  No conjunctival injection, blurred vision or eye irritation.  +amoxicillin allergic.  No exposure to poison ivy.  Patient Active Problem List  Diagnoses  . DEPRESSION  . CERUMEN IMPACTION, RIGHT  . DYSPAREUNIA  . CELLULITIS, FACE, RIGHT  . MEMORY LOSS  . SKIN RASH  . Right otitis media  . Cellulitis, face   No past medical history on file. No past surgical history on file. History  Substance Use Topics  . Smoking status: Never Smoker   . Smokeless tobacco: Not on file  . Alcohol Use: Not on file   Family History  Problem Relation Age of Onset  . Alcohol abuse Mother   . Alcohol abuse Father   . Cancer Father     stomach  . COPD Father   . Diabetes Maternal Grandmother   . Alcohol abuse Brother    Allergies  Allergen Reactions  . Amoxicillin     REACTION: as child   Current Outpatient Prescriptions on File Prior to Visit  Medication Sig Dispense Refill  . FLUoxetine (PROZAC) 20 MG/5ML solution TAKE ONE TEASPOONFUL  BY MOUTH EVERY DAY  120 mL  10  . levonorgestrel (MIRENA) 20 MCG/24HR IUD 1 each by Intrauterine route once.         Current Facility-Administered Medications on File Prior to Visit  Medication Dose Route Frequency Provider Last Rate Last Dose  . promethazine (PHENERGAN) injection 25 mg  25 mg Intramuscular 1 day or 1 dose Ruthe Mannan, MD       The PMH, PSH, Social History, Family History, Medications, and allergies have been reviewed in [facility], and have been updated if relevant.   Review of Systems See HPI    Objective:   Physical Exam BP 100/70  Pulse 88   Temp(Src) 98.6 F (37 C) (Oral)  Ht 5\' 1"  (1.549 m)  Wt 121 lb 4 oz (54.999 kg)  BMI 22.91 kg/m2  General:  Well-developed,well-nourished,in no acute distress; alert,appropriate and cooperative throughout examination Head:  normocephalic and atraumatic.   Ears:  R ear normal and L ear normal.   Nose:  no external deformity.   Mouth:  good dentition.   Neurologic:  alert & oriented X3 and gait normal.   Skin:  Pos linear raised erythematous area under left eye with surrounding erythema and wamrth, no drainage. Psych:  Cognition and judgment appear intact. Alert and cooperative with normal attention span and concentration. No apparent delusions, illusions, hallucinations    Assessment & Plan:   1. Cellulitis, face    New.  Looks more consistent with poison ivy with super infection vs cellulitis alone. Will start topical triamcinolone. Also septra twice daily (supsension as she cannot swallow pills). The patient indicates understanding of these issues and agrees with the plan.

## 2011-11-07 ENCOUNTER — Encounter: Payer: Self-pay | Admitting: Family Medicine

## 2011-11-07 ENCOUNTER — Ambulatory Visit (INDEPENDENT_AMBULATORY_CARE_PROVIDER_SITE_OTHER): Payer: BC Managed Care – PPO | Admitting: Family Medicine

## 2011-11-07 ENCOUNTER — Telehealth: Payer: Self-pay | Admitting: *Deleted

## 2011-11-07 VITALS — BP 102/76 | HR 79 | Temp 97.7°F | Ht 61.0 in | Wt 120.2 lb

## 2011-11-07 DIAGNOSIS — R300 Dysuria: Secondary | ICD-10-CM

## 2011-11-07 DIAGNOSIS — N390 Urinary tract infection, site not specified: Secondary | ICD-10-CM

## 2011-11-07 LAB — POCT URINALYSIS DIPSTICK
Glucose, UA: NEGATIVE
Nitrite, UA: NEGATIVE
Protein, UA: 30
Spec Grav, UA: 1.02
Urobilinogen, UA: 0.2
pH, UA: 7

## 2011-11-07 MED ORDER — CIPROFLOXACIN 500 MG/5ML (10%) PO SUSR: 250.0000 mg | Freq: Two times a day (BID) | ORAL | Status: DC

## 2011-11-07 MED ORDER — SULFAMETHOXAZOLE-TRIMETHOPRIM 200-40 MG/5ML PO SUSP: 20.0000 mL | Freq: Two times a day (BID) | ORAL | Status: AC

## 2011-11-07 NOTE — Telephone Encounter (Signed)
Ok will send in different rx. Rx for Bactrim sent in.

## 2011-11-07 NOTE — Progress Notes (Signed)
SUBJECTIVE: Adriana Cain is a 25 y.o. female who complains of urinary frequency, urgency and dysuria x 3 days, without flank pain, fever, chills, or abnormal vaginal discharge or bleeding.   Patient Active Problem List  Diagnoses  . DEPRESSION  . CERUMEN IMPACTION, RIGHT  . DYSPAREUNIA  . CELLULITIS, FACE, RIGHT  . MEMORY LOSS  . SKIN RASH  . Right otitis media  . Cellulitis, face   No past medical history on file. No past surgical history on file. History  Substance Use Topics  . Smoking status: Never Smoker   . Smokeless tobacco: Not on file  . Alcohol Use: Not on file   Family History  Problem Relation Age of Onset  . Alcohol abuse Mother   . Alcohol abuse Father   . Cancer Father     stomach  . COPD Father   . Diabetes Maternal Grandmother   . Alcohol abuse Brother    Allergies  Allergen Reactions  . Amoxicillin     REACTION: as child   Current Outpatient Prescriptions on File Prior to Visit  Medication Sig Dispense Refill  . FLUoxetine (PROZAC) 20 MG/5ML solution TAKE ONE TEASPOONFUL  BY MOUTH EVERY DAY  120 mL  10  . levonorgestrel (MIRENA) 20 MCG/24HR IUD 1 each by Intrauterine route once.        . triamcinolone (KENALOG) 0.025 % ointment Apply topically 2 (two) times daily.  30 g  0  . sulfamethoxazole-trimethoprim (BACTRIM,SEPTRA) 200-40 MG/5ML suspension Take 20 mLs by mouth 2 (two) times daily.  200 mL  0   Current Facility-Administered Medications on File Prior to Visit  Medication Dose Route Frequency Provider Last Rate Last Dose  . promethazine (PHENERGAN) injection 25 mg  25 mg Intramuscular 1 day or 1 dose Ruthe Mannan, MD       The PMH, PSH, Social History, Family History, Medications, and allergies have been reviewed in [facility], and have been updated if relevant.  OBJECTIVE:  BP 102/76  Pulse 79  Temp(Src) 97.7 F (36.5 C) (Oral)  Ht 5\' 1"  (1.549 m)  Wt 120 lb 4 oz (54.545 kg)  BMI 22.72 kg/m2 Appears well, in no apparent distress.  Vital  signs are normal. The abdomen is soft without tenderness, guarding, mass, rebound or organomegaly. No CVA tenderness or inguinal adenopathy noted. Urine dipstick shows positive for RBC's and positive for protein.    ASSESSMENT: UTI uncomplicated without evidence of pyelonephritis  PLAN: Treatment per orders - cipro 250 mg twice daily for 5 days, also push fluids, may use Pyridium OTC prn. Call or return to clinic prn if these symptoms worsen or fail to improve as anticipated. Marland Kitchen

## 2011-11-07 NOTE — Patient Instructions (Signed)
Take cipro as directed. You can buy Azo over the counter to help with burning. Have a great Thanksgiving.

## 2011-11-07 NOTE — Telephone Encounter (Signed)
Pharmacy needs clarification on cipro rx, they did not pull up cipro suspension in their database only tabs and IV solution. They need to know if it was something you wanted compounded (if so needs to be called to a compounding pharm)or if different  rx can be called in.

## 2011-11-07 NOTE — Telephone Encounter (Signed)
Pharmacist at [facility] notified via telephone of the medication change.

## 2011-11-10 ENCOUNTER — Other Ambulatory Visit: Payer: Self-pay | Admitting: Family Medicine

## 2011-11-10 LAB — URINE CULTURE

## 2011-11-10 MED ORDER — NITROFURANTOIN 25 MG/5ML PO SUSP: 100.0000 mg | Freq: Two times a day (BID) | ORAL | Status: AC

## 2011-12-20 ENCOUNTER — Telehealth: Payer: Self-pay | Admitting: Family Medicine

## 2011-12-20 NOTE — Telephone Encounter (Signed)
Spoke to patients spouse and he stated that he is going to take his wife to an Urgent Care because she can't go back to work without a Chiropractor note.  Offered him an appt for his wife with Dr. Dayton Martes on 12/21/2011 at 10:15 but he refused.  Advised him to call back and let us know how her visit goes.

## 2011-12-20 NOTE — Telephone Encounter (Signed)
Pt has had symptoms for 2-3 days and work has sent her home due to fever.  Pt has appt for Friday; however, husband says she needs to be seen sooner.  Please call back at [phone].

## 2011-12-22 ENCOUNTER — Ambulatory Visit: Payer: BC Managed Care – PPO | Admitting: Family Medicine

## 2011-12-22 DIAGNOSIS — Z0289 Encounter for other administrative examinations: Secondary | ICD-10-CM

## 2013-02-10 ENCOUNTER — Encounter: Payer: Self-pay | Admitting: Family Medicine

## 2013-02-10 ENCOUNTER — Ambulatory Visit (INDEPENDENT_AMBULATORY_CARE_PROVIDER_SITE_OTHER): Payer: BC Managed Care – PPO | Admitting: Family Medicine

## 2013-02-10 ENCOUNTER — Encounter: Payer: Self-pay | Admitting: *Deleted

## 2013-02-10 VITALS — BP 112/74 | HR 104 | Temp 98.2°F | Wt 124.8 lb

## 2013-02-10 DIAGNOSIS — H9390 Unspecified disorder of ear, unspecified ear: Secondary | ICD-10-CM

## 2013-02-10 DIAGNOSIS — J069 Acute upper respiratory infection, unspecified: Secondary | ICD-10-CM | POA: Insufficient documentation

## 2013-02-10 DIAGNOSIS — H9391 Unspecified disorder of right ear: Secondary | ICD-10-CM

## 2013-02-10 DIAGNOSIS — Z01118 Encounter for examination of ears and hearing with other abnormal findings: Secondary | ICD-10-CM | POA: Insufficient documentation

## 2013-02-10 MED ORDER — GUAIFENESIN-CODEINE 100-10 MG/5ML PO SYRP: 5.0000 mL | ORAL_SOLUTION | Freq: Every evening | ORAL | Status: DC | PRN

## 2013-02-10 NOTE — Progress Notes (Addendum)
Subjective:    Patient ID: Adriana Cain, female    DOB: 1986-03-18, 27 y.o.   MRN: [id number]  HPI CC: cough ,ST  4 d h/o ST and cough.  Dry cough.  Cough keeping her up some at night.  Mild RN and PNdrainage.  Hasn't tried any OTC med for this.  Drinking OJ and water, tea at work.  No fevers/chills, abd pain, nausea, ear or tooth pain, HA, SOB/wheezing.  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 No sick contacts at home. No smokers at home. No h/o asthma.  H/o childhood ear infections, with h/o ear tubes as infant.  Has had AOM in last few years as well.  Medications and allergies reviewed and updated in chart.  Past histories reviewed and updated if relevant as below. Patient Active Problem List  Diagnosis  . DEPRESSION  . CERUMEN IMPACTION, RIGHT  . DYSPAREUNIA  . CELLULITIS, FACE, RIGHT  . MEMORY LOSS  . SKIN RASH  . Right otitis media  . Cellulitis, face  . Viral URI with cough  . Abnormal ear exam   History reviewed. No pertinent past medical history. History reviewed. No pertinent past surgical history. History  Substance Use Topics  . Smoking status: Never Smoker   . Smokeless tobacco: Never Used  . Alcohol Use: No   Family History  Problem Relation Age of Onset  . Alcohol abuse Mother   . Alcohol abuse Father   . Cancer Father     stomach  . COPD Father   . Diabetes Maternal Grandmother   . Alcohol abuse Brother    Allergies  Allergen Reactions  . Amoxicillin     REACTION: as child   Current Outpatient Prescriptions on File Prior to Visit  Medication Sig Dispense Refill  . levonorgestrel (MIRENA) 20 MCG/24HR IUD 1 each by Intrauterine route once.        Marland Kitchen FLUoxetine (PROZAC) 20 MG/5ML solution TAKE ONE TEASPOONFUL  BY MOUTH EVERY DAY  120 mL  10  . [DISCONTINUED] promethazine (PHENERGAN) 6.25 MG/5ML syrup Take 10 mLs (12.5 mg total) by mouth 4 (four) times daily as needed for nausea.  120 mL  0   No current facility-administered medications on  file prior to visit.     Review of Systems Per HPI    Objective:   Physical Exam  Nursing note and vitals reviewed. Constitutional: She appears well-developed and well-nourished. No distress.  HENT:  Head: Normocephalic and atraumatic.  Right Ear: Hearing, external ear and ear canal normal.  Left Ear: Hearing, tympanic membrane, external ear and ear canal normal.  Nose: Nose normal. No mucosal edema or rhinorrhea. Right sinus exhibits no maxillary sinus tenderness and no frontal sinus tenderness. Left sinus exhibits no maxillary sinus tenderness and no frontal sinus tenderness.  Mouth/Throat: Uvula is midline and mucous membranes are normal. Posterior oropharyngeal edema and posterior oropharyngeal erythema present. No oropharyngeal exudate or tonsillar abscesses.  R TM abnomal - what seem like bullae vs swelling behind TM. No significant erythema or pain.  Eyes: Conjunctivae and EOM are normal. Pupils are equal, round, and reactive to light. No scleral icterus.  Neck: Normal range of motion. Neck supple.  Cardiovascular: Normal rate, regular rhythm, normal heart sounds and intact distal pulses.   No murmur heard. Pulmonary/Chest: Effort normal and breath sounds normal. No respiratory distress. She has no wheezes. She has no rales.  Lymphadenopathy:    She has no cervical adenopathy.  Skin: Skin is warm and dry. No rash  noted.       Assessment & Plan:

## 2013-02-10 NOTE — Patient Instructions (Addendum)
Sounds like you have a viral upper respiratory infection. Antibiotics are not needed for this.  Viral infections usually take 7-10 days to resolve.  The cough can last several weeks to go away. Use medication as prescribed: codeine cough syrup. Push fluids and plenty of rest. May use ibuprofen 400-600mg  at a time 2-3 times daily with food for throat pain. Suck on popsicles or herbal teas for symptomatic throat relief. Please return if you are not improving as expected, or if you have high fevers (>101.5) or difficulty swallowing or worsening productive cough. Call clinic with questions.  Good to see you today.  For the R ear drum - I'd like you to return in 1 month for recheck R ear.

## 2013-02-10 NOTE — Assessment & Plan Note (Signed)
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 I asked Gianna to return in 1 month to recheck ear - if persistent changes, will refer to ENT - discussed this.

## 2013-02-10 NOTE — Assessment & Plan Note (Signed)
And with pharyngitis. Treat supportively as per instructions. Update if not improving. Out of work note provided. cheratussin for cough at night.

## 2013-07-14 ENCOUNTER — Telehealth: Payer: Self-pay | Admitting: Family Medicine

## 2013-07-14 DIAGNOSIS — Z0279 Encounter for issue of other medical certificate: Secondary | ICD-10-CM

## 2013-07-14 NOTE — Telephone Encounter (Signed)
Advised patient letter is ready for pick up

## 2013-07-14 NOTE — Telephone Encounter (Signed)
Form is on your desk.

## 2013-07-14 NOTE — Telephone Encounter (Signed)
Left message asking mother to call me back

## 2013-07-14 NOTE — Telephone Encounter (Signed)
Letter written

## 2013-07-14 NOTE — Telephone Encounter (Signed)
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

## 2013-07-14 NOTE — Telephone Encounter (Signed)
Tressie Ellis dropped off and excusal form for jury duty to be completed by Dr. Dayton Martes. Her mother, Lonny Prude, can pick it up when completed. The best contact number for Gavin Pound is [phone].  Thanks, Revonda Standard

## 2013-07-14 NOTE — Telephone Encounter (Signed)
What is the reason she cannot perform jury duty?

## 2014-01-27 ENCOUNTER — Encounter: Payer: Self-pay | Admitting: Family Medicine

## 2014-01-27 ENCOUNTER — Ambulatory Visit (INDEPENDENT_AMBULATORY_CARE_PROVIDER_SITE_OTHER): Payer: BC Managed Care – PPO | Admitting: Family Medicine

## 2014-01-27 VITALS — BP 98/56 | HR 111 | Temp 98.1°F | Ht 59.75 in | Wt 111.5 lb

## 2014-01-27 DIAGNOSIS — J069 Acute upper respiratory infection, unspecified: Secondary | ICD-10-CM

## 2014-01-27 MED ORDER — AZITHROMYCIN 200 MG/5ML PO SUSR: ORAL | Status: DC

## 2014-01-27 NOTE — Progress Notes (Signed)
Pre-visit discussion using our clinic review tool. No additional management support is needed unless otherwise documented below in the visit note.  

## 2014-01-27 NOTE — Patient Instructions (Signed)
Good to see you. Take Azithromycin as directed. Delsym as needed for cough.

## 2014-01-27 NOTE — Progress Notes (Signed)
SUBJECTIVE:  Adriana Cain is a 28 y.o. female who complains of coryza, congestion, sneezing, productive cough and bilateral sinus pain for 8 days. She denies a history of anorexia, chest pain, chills, dizziness, fevers, myalgias and shortness of breath and denies a history of asthma. Patient denies smoke cigarettes.   Patient Active Problem List   Diagnosis Date Noted  . Viral URI with cough 02/10/2013  . Abnormal ear exam 02/10/2013  . Cellulitis, face 10/27/2011  . Right otitis media 06/03/2011  . DYSPAREUNIA 08/08/2010  . DEPRESSION 07/05/2010  . CELLULITIS, FACE, RIGHT 10/15/2009  . SKIN RASH 08/06/2008  . CERUMEN IMPACTION, RIGHT 01/22/2008  . 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 No past surgical history on file. History  Substance Use Topics  . Smoking status: Never Smoker   . Smokeless tobacco: Never Used  . Alcohol Use: No   Family History  Problem Relation Age of Onset  . Alcohol abuse Mother   . Alcohol abuse Father   . Cancer Father     stomach  . COPD Father   . Diabetes Maternal Grandmother   . Alcohol abuse Brother    Allergies  Allergen Reactions  . Amoxicillin     REACTION: as child   Current Outpatient Prescriptions on File Prior to Visit  Medication Sig Dispense Refill  . FLUoxetine (PROZAC) 20 MG/5ML solution TAKE ONE TEASPOONFUL  BY MOUTH EVERY DAY  120 mL  10  . guaiFENesin-codeine (ROBITUSSIN AC) 100-10 MG/5ML syrup Take 5 mLs by mouth at bedtime as needed for cough.  180 mL  0  . levonorgestrel (MIRENA) 20 MCG/24HR IUD 1 each by Intrauterine route once.        . [DISCONTINUED] promethazine (PHENERGAN) 6.25 MG/5ML syrup Take 10 mLs (12.5 mg total) by mouth 4 (four) times daily as needed for nausea.  120 mL  0   No current facility-administered medications on file prior to visit.   The PMH, PSH, Social History, Family History, Medications, and allergies [facility], and have been updated if  relevant.  OBJECTIVE: BP 98/56  Pulse 111  Temp(Src) 98.1 F (36.7 C) (Oral)  Ht 4' 11.75" (1.518 m)  Wt 111 lb 8 oz (50.576 kg)  BMI 21.95 kg/m2  SpO2 96%  She appears well, vital signs are as noted. Ears normal.  Throat and pharynx normal.  Neck supple. No adenopathy in the neck. Nose is congested. Sinusestender. The chest is clear, without wheezes or rales.  ASSESSMENT:  sinusitis  PLAN: Given duration and progression of symptoms, will treat for bacterial sinusitis with azithromycin (PCN allergic and cannot swallow pills).  Symptomatic therapy suggested: push fluids, rest and return office visit prn if symptoms persist or worsen.  Call or return to clinic prn if these symptoms worsen or fail to improve as anticipated.

## 2014-11-24 ENCOUNTER — Ambulatory Visit (INDEPENDENT_AMBULATORY_CARE_PROVIDER_SITE_OTHER): Payer: BC Managed Care – PPO | Admitting: Physician Assistant

## 2014-11-24 ENCOUNTER — Encounter: Payer: Self-pay | Admitting: Physician Assistant

## 2014-11-24 VITALS — BP 107/71 | HR 85 | Temp 97.9°F | Wt 112.0 lb

## 2014-11-24 DIAGNOSIS — J069 Acute upper respiratory infection, unspecified: Secondary | ICD-10-CM

## 2014-11-24 DIAGNOSIS — J020 Streptococcal pharyngitis: Secondary | ICD-10-CM

## 2014-11-24 LAB — POCT RAPID STREP A (OFFICE): Rapid Strep A Screen: POSITIVE — AB

## 2014-11-24 MED ORDER — CEFDINIR 250 MG/5ML PO SUSR: 300.0000 mg | Freq: Two times a day (BID) | ORAL | Status: DC

## 2014-11-24 MED ORDER — FLUTICASONE PROPIONATE 50 MCG/ACT NA SUSP: 2.0000 | Freq: Every day | NASAL | Status: DC

## 2014-11-24 MED ORDER — CEFDINIR 300 MG PO CAPS: 300.0000 mg | ORAL_CAPSULE | Freq: Two times a day (BID) | ORAL | Status: DC

## 2014-11-24 NOTE — Patient Instructions (Signed)
Increase fluid intake.  Rest.  Take antibiotic as directed with food. Salt-water gargles will help with throat. Place a humidifier in the bedroom.  Use Delsym or Mucinex-D for cough. Use Flonase daily as directed.  Strep Throat Strep throat is an infection of the throat caused by a bacteria named Streptococcus pyogenes. Your health care provider may call the infection streptococcal "tonsillitis" or "pharyngitis" depending on whether there are signs of inflammation in the tonsils or back of the throat. Strep throat is most common in children aged 5-15 years during the cold months of the year, but it can occur in people of any age during any season. This infection is spread from person to person (contagious) through coughing, sneezing, or other close contact. SIGNS AND SYMPTOMS   Fever or chills.  Painful, swollen, red tonsils or throat.  Pain or difficulty when swallowing.  White or yellow spots on the tonsils or throat.  Swollen, tender lymph nodes or "glands" of the neck or under the jaw.  Red rash all over the body (rare). DIAGNOSIS  Many different infections can cause the same symptoms. A test must be done to confirm the diagnosis so the right treatment can be given. A "rapid strep test" can help your health care provider make the diagnosis in a few minutes. If this test is not available, a light swab of the infected area can be used for a throat culture test. If a throat culture test is done, results are usually available in a day or two. TREATMENT  Strep throat is treated with antibiotic medicine. HOME CARE INSTRUCTIONS   Gargle with 1 tsp of salt in 1 cup of warm water, 3-4 times per day or as needed for comfort.  Family members who also have a sore throat or fever should be tested for strep throat and treated with antibiotics if they have the strep infection.  Make sure everyone in your household washes their hands well.  Do not share food, drinking cups, or personal items that  could cause the infection to spread to others.  You may need to eat a soft food diet until your sore throat gets better.  Drink enough water and fluids to keep your urine clear or pale yellow. This will help prevent dehydration.  Get plenty of rest.  Stay home from school, day care, or work until you have been on antibiotics for 24 hours.  Take medicines only as directed by your health care provider.  Take your antibiotic medicine as directed by your health care provider. Finish it even if you start to feel better. SEEK MEDICAL CARE IF:   The glands in your neck continue to enlarge.  You develop a rash, cough, or earache.  You cough up green, yellow-brown, or bloody sputum.  You have pain or discomfort not controlled by medicines.  Your problems seem to be getting worse rather than better.  You have a fever. SEEK IMMEDIATE MEDICAL CARE IF:   You develop any new symptoms such as vomiting, severe headache, stiff or painful neck, chest pain, shortness of breath, or trouble swallowing.  You develop severe throat pain, drooling, or changes in your voice.  You develop swelling of the neck, or the skin on the neck becomes red and tender.  You develop signs of dehydration, such as fatigue, dry mouth, and decreased urination.  You become increasingly sleepy, or you cannot wake up completely. MAKE SURE YOU:  Understand these instructions.  Will watch your condition.  Will get help right  away if you are not doing well or get worse. Document Released: 12/01/2000 Document Revised: 04/20/2014 Document Reviewed: 02/02/2011 [facility] Patient Information 2015 [place]. This information is not intended to replace advice given to you by your health care provider. Make sure you discuss any questions you have with your health care provider.

## 2014-11-24 NOTE — Progress Notes (Signed)
Pre visit review using our clinic review tool, if applicable. No additional management support is needed unless otherwise documented below in the visit note. 

## 2014-11-24 NOTE — Assessment & Plan Note (Signed)
Patient penicillin-allergic but unsure of reaction.  Cannot tolerate pills.  Will send in Cefdinir suspension to take as directed. Increase fluids.  Tylenol for sore throat. Salt-water gargles may also be beneficial.

## 2014-11-24 NOTE — Progress Notes (Signed)
Patient presents to clinic today c/o sore throat, runny nose and mild, dry cough x 1 day.  Denies fever, chills, shortness of breath, recent travel or sick contact.  No past medical history on file.  Current Outpatient Prescriptions on File Prior to Visit  Medication Sig Dispense Refill  . azithromycin (ZITHROMAX) 200 MG/5ML suspension 12.5 ml daily x 3 days (Patient not taking: Reported on 11/24/2014) 14 mL 0  . FLUoxetine (PROZAC) 20 MG/5ML solution TAKE ONE TEASPOONFUL  BY MOUTH EVERY DAY (Patient not taking: Reported on 11/24/2014) 120 mL 10  . guaiFENesin-codeine (ROBITUSSIN AC) 100-10 MG/5ML syrup Take 5 mLs by mouth at bedtime as needed for cough. (Patient not taking: Reported on 11/24/2014) 180 mL 0  . levonorgestrel (MIRENA) 20 MCG/24HR IUD 1 each by Intrauterine route once.      . [DISCONTINUED] promethazine (PHENERGAN) 6.25 MG/5ML syrup Take 10 mLs (12.5 mg total) by mouth 4 (four) times daily as needed for nausea. 120 mL 0   No current facility-administered medications on file prior to visit.    Allergies  Allergen Reactions  . Amoxicillin     REACTION: as child    Family History  Problem Relation Age of Onset  . Alcohol abuse Mother   . Alcohol abuse Father   . Cancer Father     stomach  . COPD Father   . Diabetes Maternal Grandmother   . Alcohol abuse Brother     History   Social History  . Marital Status: Married    Spouse Name: N/A    Number of Children: 0  . Years of Education: N/A   Occupational History  . [employer] Aid at [employer]    Social History Main Topics  . Smoking status: Never Smoker   . Smokeless tobacco: Never Used  . Alcohol Use: No  . Drug Use: No  . Sexual Activity: None   Other Topics Concern  . 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  All other ROS are negative.  BP 107/71 mmHg  Pulse 85  Temp(Src) 97.9 F (36.6 C)  Wt 112 lb (50.803 kg)   SpO2 100%  Physical Exam  Constitutional: She is well-developed, well-nourished, and in no distress.  HENT:  Head: Normocephalic and atraumatic.  Right Ear: External ear and ear canal normal. A middle ear effusion is present.  Left Ear: External ear and ear canal normal. A middle ear effusion is present.  Nose: Nose normal. Right sinus exhibits no maxillary sinus tenderness and no frontal sinus tenderness. Left sinus exhibits no maxillary sinus tenderness and no frontal sinus tenderness.  Mouth/Throat: Uvula is midline and mucous membranes are normal. Posterior oropharyngeal erythema present. No oropharyngeal exudate or tonsillar abscesses.  Eyes: Conjunctivae are normal. Pupils are equal, round, and reactive to light.  Neck: Neck supple.  Cardiovascular: Normal rate, regular rhythm, normal heart sounds and intact distal pulses.   Pulmonary/Chest: Effort normal and breath sounds normal. No respiratory distress. She has no wheezes. She has no rales. She exhibits no tenderness.  Lymphadenopathy:    She has no cervical adenopathy.  Neurological: She is alert.  Skin: Skin is dry. No rash noted.  Vitals reviewed.  Assessment/Plan: Streptococcal sore throat Patient penicillin-allergic but unsure of reaction.  Cannot tolerate pills.  Will send in Cefdinir suspension to take as directed. Increase fluids.  Tylenol for sore throat.  Salt-water gargles may also be beneficial.   Acute upper respiratory infection Increase fluids.  Rest.  Saline nasal spray. Delsym for cough.  Continue treatment for strep throat as directed.

## 2014-11-24 NOTE — Assessment & Plan Note (Signed)
Increase fluids.  Rest.  Saline nasal spray. Delsym for cough.  Continue treatment for strep throat as directed.

## 2015-10-22 ENCOUNTER — Encounter: Payer: Self-pay | Admitting: *Deleted

## 2015-10-22 ENCOUNTER — Encounter: Payer: Self-pay | Admitting: Family Medicine

## 2015-10-22 ENCOUNTER — Ambulatory Visit (INDEPENDENT_AMBULATORY_CARE_PROVIDER_SITE_OTHER): Payer: BLUE CROSS/BLUE SHIELD | Admitting: Family Medicine

## 2015-10-22 VITALS — BP 128/60 | HR 100 | Temp 98.8°F | Wt 131.8 lb

## 2015-10-22 DIAGNOSIS — H66001 Acute suppurative otitis media without spontaneous rupture of ear drum, right ear: Secondary | ICD-10-CM | POA: Insufficient documentation

## 2015-10-22 MED ORDER — CEFDINIR 250 MG/5ML PO SUSR: 250.0000 mg | Freq: Two times a day (BID) | ORAL | Status: AC

## 2015-10-22 NOTE — Patient Instructions (Signed)
Mucinex DM twice daily.  Start nasal saline spray 2-3 times a day.  Start flonase 2 spray per nostril daily. Complete antibiotics.  Call if not improving as expected.

## 2015-10-22 NOTE — Progress Notes (Signed)
   Subjective:    Patient ID: Adriana Cain, female    DOB: 1986/01/26, 29 y.o.   MRN: [id number]  Cough This is a new problem. The current episode started yesterday (2 weeks of congestion,  in last 24 hours body ache fatigue, works in nursing home). The problem has been rapidly worsening. The cough is non-productive. Associated symptoms include nasal congestion. Pertinent negatives include no chills, ear congestion, ear pain, fever, myalgias, sore throat, shortness of breath or wheezing. Associated symptoms comments: No sinus pressure or pain  bright yellow mucus from nose. . The symptoms are aggravated by lying down. Risk factors: nonsmoker. Treatments tried: dayquil. The treatment provided mild relief. Her past medical history is significant for environmental allergies. There is no history of asthma, bronchiectasis, COPD, emphysema or pneumonia.    Social History /Family History/Past Medical History reviewed and updated if needed.  No asthma in past  No recent antibiotics.  Review of Systems  Constitutional: Negative for fever and chills.  HENT: Negative for ear pain and sore throat.   Respiratory: Positive for cough. Negative for shortness of breath and wheezing.   Musculoskeletal: Negative for myalgias.  Allergic/Immunologic: Positive for environmental allergies.       Objective:   Physical Exam  Constitutional: Vital signs are normal. She appears well-developed and well-nourished. She is cooperative.  Non-toxic appearance. She does not appear ill. No distress.  HENT:  Head: Normocephalic.  Right Ear: Hearing, external ear and ear canal normal. Tympanic membrane is injected, erythematous and bulging. Tympanic membrane is not retracted. A middle ear effusion is present.  Left Ear: Hearing, tympanic membrane, external ear and ear canal normal. Tympanic membrane is not injected, not erythematous, not retracted and not bulging.  No middle ear effusion.  Nose: Mucosal edema and  rhinorrhea present. Right sinus exhibits no maxillary sinus tenderness and no frontal sinus tenderness. Left sinus exhibits no maxillary sinus tenderness and no frontal sinus tenderness.  Mouth/Throat: Uvula is midline and mucous membranes are normal. Posterior oropharyngeal erythema present. No oropharyngeal exudate or posterior oropharyngeal edema.  Eyes: Conjunctivae, EOM and lids are normal. Pupils are equal, round, and reactive to light. Lids are everted and swept, no foreign bodies found.  Neck: Trachea normal and normal range of motion. Neck supple. Carotid bruit is not present. No thyroid mass and no thyromegaly present.  Cardiovascular: Normal rate, regular rhythm, S1 normal, S2 normal, normal heart sounds, intact distal pulses and normal pulses.  Exam reveals no gallop and no friction rub.   No murmur heard. Pulmonary/Chest: Effort normal and breath sounds normal. No tachypnea. No respiratory distress. She has no decreased breath sounds. She has no wheezes. She has no rhonchi. She has no rales.  Neurological: She is alert.  Skin: Skin is warm, dry and intact. No rash noted.  Psychiatric: Her speech is normal and behavior is normal. Judgment normal. Her mood appears not anxious. Cognition and memory are normal. She does not exhibit a depressed mood.          Assessment & Plan:

## 2015-10-22 NOTE — Assessment & Plan Note (Signed)
Treat with nasal saline irrigation, mucolytic , nasal steroid and course of antibiotics.

## 2018-10-07 ENCOUNTER — Ambulatory Visit: Payer: Self-pay | Admitting: Family Medicine

## 2018-10-07 ENCOUNTER — Encounter: Payer: Self-pay | Admitting: Family Medicine

## 2018-10-07 VITALS — BP 108/64 | HR 89 | Temp 98.4°F | Ht 59.75 in | Wt 122.6 lb

## 2018-10-07 DIAGNOSIS — J0100 Acute maxillary sinusitis, unspecified: Secondary | ICD-10-CM

## 2018-10-07 MED ORDER — CEFDINIR 300 MG PO CAPS: 300.0000 mg | ORAL_CAPSULE | Freq: Two times a day (BID) | ORAL | 0 refills | Status: DC

## 2018-10-07 NOTE — Progress Notes (Signed)
SUBJECTIVE:  Adriana Cain is a 32 y.o. female who complains of coryza, congestion, productive cough and bilateral sinus pain for 7 days. She denies a history of anorexia, chest pain, chills, dizziness and fatigue and denies a history of asthma. Patient denies smoke cigarettes.  Has tried mucinex but symptoms continue to worsen.   Current Outpatient Medications on File Prior to Visit  Medication Sig Dispense Refill  . levonorgestrel (MIRENA) 20 MCG/24HR IUD 1 each by Intrauterine route once.      . triamcinolone (KENALOG) 0.025 % cream Apply 1 application topically 2 (two) times daily.    . [DISCONTINUED] promethazine (PHENERGAN) 6.25 MG/5ML syrup Take 10 mLs (12.5 mg total) by mouth 4 (four) times daily as needed for nausea. 120 mL 0   No current facility-administered medications on file prior to visit.     Allergies  Allergen Reactions  . Amoxicillin     REACTION: as child    No past medical history on file.  No past surgical history on file.  Family History  Problem Relation Age of Onset  . Alcohol abuse Mother   . Alcohol abuse Father   . Cancer Father        stomach  . COPD Father   . Diabetes Maternal Grandmother   . Alcohol abuse Brother     Social History   Socioeconomic History  . Marital status: Married    Spouse name: Not on file  . Number of children: 0  . Years of education: Not on file  . Highest education level: Not on file  Occupational History  . Occupation: Diet Aid at [employer]  Social Needs  . Financial resource strain: Not on file  . Food insecurity:    Worry: Not on file    Inability: Not on file  . Transportation needs:    Medical: Not on file    Non-medical: Not on file  Tobacco Use  . Smoking status: Never Smoker  . Smokeless tobacco: Never Used  Substance and Sexual Activity  . Alcohol use: No  . Drug use: No  . Sexual activity: Not on file  Lifestyle  . Physical activity:    Days per week: Not on file    Minutes  per session: Not on file  . Stress: Not on file  Relationships  . Social connections:    Talks on phone: Not on file    Gets together: Not on file    Attends religious service: Not on file    Active member of club or organization: Not on file    Attends meetings of clubs or organizations: Not on file    Relationship status: Not on file  . Intimate partner violence:    Fear of current or ex partner: Not on file    Emotionally abused: Not on file    Physically abused: Not on file    Forced sexual activity: Not on file  Other Topics Concern  . Not on file  Social History Narrative   Married 9/08      Diet aid at [employer]   The PMH, PSH, Social History, Family History, Medications, and allergies have been reviewed in [facility], and have been updated if relevant.  OBJECTIVE: BP 108/64 (BP Location: Left Arm, Patient Position: Sitting, Cuff Size: Normal)   Pulse 89   Temp 98.4 F (36.9 C) (Oral)   Ht 4' 11.75" (1.518 m)   Wt 122 lb 9.6 oz (55.6 kg)   SpO2 97%  BMI 24.14 kg/m   She appears well, vital signs are as noted. Ears normal.  Throat and pharynx normal.  Neck supple. No adenopathy in the neck. Nose is congested. Sinuses  Tender throughout. The chest is clear, without wheezes or rales.  ASSESSMENT:  sinusitis  PLAN: PCN allergic.  Cannot swallow pills- she can take capsules and break them apart into apple sauce.  She is not allergic to [facility]- eRx sent for omnicef 300 mg twice daily x 7 days. Symptomatic therapy suggested: push fluids, rest and return office visit prn if symptoms persist or worsen.Call or return to clinic prn if these symptoms worsen or fail to improve as anticipated.

## 2018-10-07 NOTE — Patient Instructions (Signed)
Great to see you. Take omnicef as directed- 1 tablet twice daily x 7 days. Drink lots of fluids. Continue mucinex.

## 2018-12-16 ENCOUNTER — Encounter: Payer: Self-pay | Admitting: Nurse Practitioner

## 2018-12-16 ENCOUNTER — Ambulatory Visit: Payer: PRIVATE HEALTH INSURANCE | Admitting: Nurse Practitioner

## 2018-12-16 VITALS — BP 110/70 | HR 113 | Temp 98.8°F | Ht 59.75 in | Wt 116.6 lb

## 2018-12-16 DIAGNOSIS — J101 Influenza due to other identified influenza virus with other respiratory manifestations: Secondary | ICD-10-CM

## 2018-12-16 LAB — POC INFLUENZA A&B (BINAX/QUICKVUE)
Influenza A, POC: NEGATIVE
Influenza B, POC: POSITIVE — AB

## 2018-12-16 MED ORDER — OSELTAMIVIR PHOSPHATE 6 MG/ML PO SUSR: 75.0000 mg | Freq: Two times a day (BID) | ORAL | 0 refills | Status: DC

## 2018-12-16 MED ORDER — SALINE SPRAY 0.65 % NA SOLN: 1.0000 | NASAL | 0 refills | Status: DC | PRN

## 2018-12-16 MED ORDER — FLUTICASONE PROPIONATE 50 MCG/ACT NA SUSP: 2.0000 | Freq: Every day | NASAL | 0 refills | Status: DC

## 2018-12-16 MED ORDER — PROMETHAZINE-DM 6.25-15 MG/5ML PO SYRP: 5.0000 mL | ORAL_SOLUTION | Freq: Three times a day (TID) | ORAL | 0 refills | Status: DC | PRN

## 2018-12-16 MED ORDER — PHENYLEPHRINE-APAP-GUAIFENESIN 10-650-400 MG/20ML PO LIQD: 15.0000 mL | Freq: Three times a day (TID) | ORAL | Status: DC | PRN

## 2018-12-16 NOTE — Patient Instructions (Signed)
Maintain adequate oral hydration. Return to office if not improvement in 1week.  Influenza, Adult Influenza, more commonly known as "the flu," is a viral infection that mainly affects the respiratory tract. The respiratory tract includes organs that help you breathe, such as the lungs, nose, and throat. The flu causes many symptoms similar to the common cold along with high fever and body aches. The flu spreads easily from person to person (is contagious). Getting a flu shot (influenza vaccination) every year is the best way to prevent the flu. What are the causes? This condition is caused by the influenza virus. You can get the virus by:  Breathing in droplets that are in the air from an infected person's cough or sneeze.  Touching something that has been exposed to the virus (has been contaminated) and then touching your mouth, nose, or eyes. What increases the risk? The following factors may make you more likely to get the flu:  Not washing or sanitizing your hands often.  Having close contact with many people during cold and flu season.  Touching your mouth, eyes, or nose without first washing or sanitizing your hands.  Not getting a yearly (annual) flu shot. You may have a higher risk for the flu, including serious problems such as a lung infection (pneumonia), if you:  Are older than 65.  Are pregnant.  Have a weakened disease-fighting system (immune system). You may have a weakened immune system if you: ? Have HIV or AIDS. ? Are undergoing chemotherapy. ? Are taking medicines that reduce (suppress) the activity of your immune system.  Have a long-term (chronic) illness, such as heart disease, kidney disease, diabetes, or lung disease.  Have a liver disorder.  Are severely overweight (morbidly obese).  Have anemia. This is a condition that affects your red blood cells.  Have asthma. What are the signs or symptoms? Symptoms of this condition usually begin suddenly and  last 4-14 days. They may include:  Fever and chills.  Headaches, body aches, or muscle aches.  Sore throat.  Cough.  Runny or stuffy (congested) nose.  Chest discomfort.  Poor appetite.  Weakness or fatigue.  Dizziness.  Nausea or vomiting. How is this diagnosed? This condition may be diagnosed based on:  Your symptoms and medical history.  A physical exam.  Swabbing your nose or throat and testing the fluid for the influenza virus. How is this treated? If the flu is diagnosed early, you can be treated with medicine that can help reduce how severe the illness is and how long it lasts (antiviral medicine). This may be given by mouth (orally) or through an IV. Taking care of yourself at home can help relieve symptoms. Your health care provider may recommend:  Taking over-the-counter medicines.  Drinking plenty of fluids. In many cases, the flu goes away on its own. If you have severe symptoms or complications, you may be treated in a hospital. Follow these instructions at home: Activity  Rest as needed and get plenty of sleep.  Stay home from work or school as told by your health care provider. Unless you are visiting your health care provider, avoid leaving home until your fever has been gone for 24 hours without taking medicine. Eating and drinking  Take an oral rehydration solution (ORS). This is a drink that is sold at pharmacies and retail stores.  Drink enough fluid to keep your urine pale yellow.  Drink clear fluids in small amounts as you are able. Clear fluids include water, ice  chips, diluted fruit juice, and low-calorie sports drinks.  Eat bland, easy-to-digest foods in small amounts as you are able. These foods include bananas, applesauce, rice, lean meats, toast, and crackers.  Avoid drinking fluids that contain a lot of sugar or caffeine, such as energy drinks, regular sports drinks, and soda.  Avoid alcohol.  Avoid spicy or fatty foods. General  instructions      Take over-the-counter and prescription medicines only as told by your health care provider.  Use a cool mist humidifier to add humidity to the air in your home. This can make it easier to breathe.  Cover your mouth and nose when you cough or sneeze.  Wash your hands with soap and water often, especially after you cough or sneeze. If soap and water are not available, use alcohol-based hand sanitizer.  Keep all follow-up visits as told by your health care provider. This is important. How is this prevented?   Get an annual flu shot. You may get the flu shot in late summer, fall, or winter. Ask your health care provider when you should get your flu shot.  Avoid contact with people who are sick during cold and flu season. This is generally fall and winter. Contact a health care provider if:  You develop new symptoms.  You have: ? Chest pain. ? Diarrhea. ? A fever.  Your cough gets worse.  You produce more mucus.  You feel nauseous or you vomit. Get help right away if:  You develop shortness of breath or difficulty breathing.  Your skin or nails turn a bluish color.  You have severe pain or stiffness in your neck.  You develop a sudden headache or sudden pain in your face or ear.  You cannot eat or drink without vomiting. Summary  Influenza, more commonly known as "the flu," is a viral infection that primarily affects your respiratory tract.  Symptoms of the flu usually begin suddenly and last 4-14 days.  Getting an annual flu shot is the best way to prevent getting the flu.  Stay home from work or school as told by your health care provider. Unless you are visiting your health care provider, avoid leaving home until your fever has been gone for 24 hours without taking medicine.  Keep all follow-up visits as told by your health care provider. This is important. This information is not intended to replace advice given to you by your health care  provider. Make sure you discuss any questions you have with your health care provider. Document Released: 12/01/2000 Document Revised: 05/22/2018 Document Reviewed: 05/22/2018 Elsevier Interactive Patient Education  2019 [employer].

## 2018-12-16 NOTE — Progress Notes (Signed)
Subjective:  Patient ID: Adriana Cain, female    DOB: [birth year]  Age: 32 y.o. MRN: [id number]  CC: Cough (non productive cough, sore throat, 100.2 fever, headache, sick on stomach/ lasting x3 days/  taking musinex and tylenol/ daughter had sinus infection)  URI   This is a new problem. The current episode started in the past 7 days. The problem has been unchanged. The maximum temperature recorded prior to her arrival was 100.4 - 100.9 F. Associated symptoms include congestion, coughing, headaches, nausea, a plugged ear sensation, rhinorrhea, sinus pain, sneezing and a sore throat. Pertinent negatives include no chest pain, ear pain, swollen glands, vomiting or wheezing. She has tried decongestant, acetaminophen and NSAIDs for the symptoms. The treatment provided mild relief.  flu vaccine administered 2month ago.  Reviewed past Medical, Social and Family history today.  Outpatient Medications Prior to Visit  Medication Sig Dispense Refill  . levonorgestrel (MIRENA) 20 MCG/24HR IUD 1 each by Intrauterine route once.      . cefdinir (OMNICEF) 300 MG capsule Take 1 capsule (300 mg total) by mouth 2 (two) times daily. (Patient not taking: Reported on 12/16/2018) 14 capsule 0  . triamcinolone (KENALOG) 0.025 % cream Apply 1 application topically 2 (two) times daily.     No facility-administered medications prior to visit.     ROS See HPI  Objective:  BP 110/70   Pulse (!) 113   Temp 98.8 F (37.1 C) (Oral)   Ht 4' 11.75" (1.518 m)   Wt 116 lb 9.6 oz (52.9 kg)   SpO2 97%   BMI 22.96 kg/m   BP Readings from Last 3 Encounters:  12/16/18 110/70  10/07/18 108/64  10/22/15 128/60    Wt Readings from Last 3 Encounters:  12/16/18 116 lb 9.6 oz (52.9 kg)  10/07/18 122 lb 9.6 oz (55.6 kg)  10/22/15 131 lb 12 oz (59.8 kg)    Physical Exam Vitals signs reviewed.  Constitutional:      Appearance: She is ill-appearing.  HENT:     Head:     Jaw: No trismus.     Right Ear:  Ear canal and external ear normal. No tenderness. A middle ear effusion is present. Tympanic membrane is erythematous. Tympanic membrane is not retracted or bulging.     Left Ear: Ear canal and external ear normal. No tenderness. A middle ear effusion is present. Tympanic membrane is not erythematous, retracted or bulging.     Nose: Mucosal edema and rhinorrhea present.     Right Sinus: Maxillary sinus tenderness present. No frontal sinus tenderness.     Left Sinus: Maxillary sinus tenderness present. No frontal sinus tenderness.     Mouth/Throat:     Pharynx: Uvula midline. Posterior oropharyngeal erythema present. No oropharyngeal exudate.  Eyes:     General: No scleral icterus. Neck:     Musculoskeletal: Normal range of motion and neck supple.  Cardiovascular:     Rate and Rhythm: Normal rate.     Heart sounds: Normal heart sounds.  Pulmonary:     Effort: Pulmonary effort is normal.     Breath sounds: Normal breath sounds.  Lymphadenopathy:     Cervical: No cervical adenopathy.  Neurological:     Mental Status: She is alert and oriented to person, place, and time.     Lab Results  Component Value Date   WBC 11.7 (H) 10/23/2008   HGB (LL) 10/23/2008    7.3 CRITICAL RESULT CALLED TO, READ BACK BY AND VERIFIED  WITH: LASSITER,L AT 0640 ON [id number] BY WOODSL   HCT 21.1 (L) 10/23/2008   PLT 180 10/23/2008    Assessment & Plan:   Adriana Cain was seen today for cough.  Diagnoses and all orders for this visit:  Influenza B -     POC Influenza A&B(BINAX/QUICKVUE) -     promethazine-dextromethorphan (PROMETHAZINE-DM) 6.25-15 MG/5ML syrup; Take 5 mLs by mouth 3 (three) times daily as needed for cough. -     oseltamivir (TAMIFLU) 6 MG/ML SUSR suspension; Take 12.5 mLs (75 mg total) by mouth 2 (two) times daily. -     fluticasone (FLONASE) 50 MCG/ACT nasal spray; Place 2 sprays into both nostrils daily. -     sodium chloride (OCEAN) 0.65 % SOLN nasal spray; Place 1 spray into both  nostrils as needed for congestion. -     Phenylephrine-APAP-guaiFENesin (MUCINEX FAST-MAX) 10-650-400 MG/20ML LIQD; Take 15 mLs by mouth every 8 (eight) hours as needed.   I am having Adriana Cain start on promethazine-dextromethorphan, oseltamivir, fluticasone, sodium chloride, and Phenylephrine-APAP-guaiFENesin. I am also having her maintain her levonorgestrel, triamcinolone, and cefdinir.  Meds ordered this encounter  Medications  . promethazine-dextromethorphan (PROMETHAZINE-DM) 6.25-15 MG/5ML syrup    Sig: Take 5 mLs by mouth 3 (three) times daily as needed for cough.    Dispense:  120 mL    Refill:  0    Order Specific Question:   Supervising Provider    Answer:   Dianne DunARON, TALIA M [[id number]]  . oseltamivir (TAMIFLU) 6 MG/ML SUSR suspension    Sig: Take 12.5 mLs (75 mg total) by mouth 2 (two) times daily.    Dispense:  125 mL    Refill:  0    Order Specific Question:   Supervising Provider    Answer:   Dianne DunARON, TALIA M [[id number]]  . fluticasone (FLONASE) 50 MCG/ACT nasal spray    Sig: Place 2 sprays into both nostrils daily.    Dispense:  16 g    Refill:  0    Order Specific Question:   Supervising Provider    Answer:   Dianne DunARON, TALIA M [[id number]]  . sodium chloride (OCEAN) 0.65 % SOLN nasal spray    Sig: Place 1 spray into both nostrils as needed for congestion.    Dispense:  15 mL    Refill:  0    Order Specific Question:   Supervising Provider    Answer:   Dianne DunARON, TALIA M [[id number]]  . Phenylephrine-APAP-guaiFENesin (MUCINEX FAST-MAX) 10-650-400 MG/20ML LIQD    Sig: Take 15 mLs by mouth every 8 (eight) hours as needed.    Order Specific Question:   Supervising Provider    Answer:   Dianne DunARON, TALIA M [[id number]]    Problem List Items Addressed This Visit    None    Visit Diagnoses    Influenza B    -  Primary   Relevant Medications   promethazine-dextromethorphan (PROMETHAZINE-DM) 6.25-15 MG/5ML syrup   oseltamivir (TAMIFLU) 6 MG/ML SUSR suspension   fluticasone (FLONASE) 50 MCG/ACT nasal  spray   sodium chloride (OCEAN) 0.65 % SOLN nasal spray   Phenylephrine-APAP-guaiFENesin (MUCINEX FAST-MAX) 10-650-400 MG/20ML LIQD   Other Relevant Orders   POC Influenza A&B(BINAX/QUICKVUE) (Completed)       Follow-up: No follow-ups on file.  Alysia Pennaharlotte Evangelia Whitaker, NP

## 2018-12-19 ENCOUNTER — Telehealth: Payer: Self-pay

## 2018-12-19 NOTE — Telephone Encounter (Signed)
Copied from CRM [id number]. Topic: General - Other >> Dec 19, 2018 11:19 AM Lorayne Bender wrote: Reason for CRM:   Pt's mother calling.  States that pt was DX with the flu and told not to go to work.  Pt is feeling much better and would like to go back to work tomorrow, but needs a note for the time she has been out.   Gavin Pound can be reached at [phone]

## 2019-12-04 ENCOUNTER — Other Ambulatory Visit: Payer: Self-pay

## 2019-12-05 ENCOUNTER — Ambulatory Visit: Payer: PRIVATE HEALTH INSURANCE | Admitting: Family Medicine

## 2019-12-05 ENCOUNTER — Encounter: Payer: Self-pay | Admitting: Family Medicine

## 2019-12-05 DIAGNOSIS — B353 Tinea pedis: Secondary | ICD-10-CM

## 2019-12-05 MED ORDER — FLUCONAZOLE 40 MG/ML PO SUSR: 150.0000 mg | ORAL | 0 refills | Status: AC

## 2019-12-05 NOTE — Progress Notes (Signed)
Adriana Cain - 33 y.o. female MRN [id number]  Date of birth: 26-Jul-1986  Subjective Chief Complaint  Patient presents with  . Foot Pain    Pt skin at the bottom of her foot started to peel 2days ago and its painful when she walks.    HPI Adriana Cain is a 33 y.o. female here today with complaint of foot pain.  She is accompanied by her mother to today's visit.  She reports that she noticed some redness and itchiness of her foot a few days ago, then begin to have some pain and skin peeled from bottom of foot two days ago.  It is still painful to walk on the forefoot.  She denies fever, chills, joint pain.    ROS:  A comprehensive ROS was completed and negative except as noted per HPI  Allergies  Allergen Reactions  . Amoxicillin     REACTION: as child    No past medical history on file.  No past surgical history on file.  Social History   Socioeconomic History  . Marital status: Married    Spouse name: Not on file  . Number of children: 0  . Years of education: Not on file  . Highest education level: Not on file  Occupational History  . Occupation: Diet Aid at [employer]  Tobacco Use  . Smoking status: Never Smoker  . Smokeless tobacco: Never Used  Substance and Sexual Activity  . Alcohol use: No  . Drug use: No  . Sexual activity: Not on file  Other Topics Concern  . Not on file  Social History Narrative   Married 9/08      Diet aid at [place] Strain:   . Difficulty of Paying Living Expenses: Not on file  Food Insecurity:   . Worried About Charity fundraiser in the Last Year: Not on file  . Ran Out of Food in the Last Year: Not on file  Transportation Needs:   . Lack of Transportation (Medical): Not on file  . Lack of Transportation (Non-Medical): Not on file  Physical Activity:   . Days of Exercise per Week: Not on file  . Minutes of Exercise per Session: Not on file    Stress:   . Feeling of Stress : Not on file  Social Connections:   . Frequency of Communication with Friends and Family: Not on file  . Frequency of Social Gatherings with Friends and Family: Not on file  . Attends Religious Services: Not on file  . Active Member of Clubs or Organizations: Not on file  . Attends Archivist Meetings: Not on file  . Marital Status: Not on file    Family History  Problem Relation Age of Onset  . Alcohol abuse Mother   . Alcohol abuse Father   . Cancer Father        stomach  . COPD Father   . Diabetes Maternal Grandmother   . Alcohol abuse Brother     Health Maintenance  Topic Date Due  . HIV Screening  05/28/2000  . TETANUS/TDAP  05/28/2004  . PAP SMEAR-Modifier  03/14/2014  . INFLUENZA VACCINE  07/19/2019    ----------------------------------------------------------------------------------------------------------------------------------------------------------------------------------------------------------------- Physical Exam BP 100/70   Pulse 92   Temp 98.1 F (36.7 C) (Temporal)   Ht 4' 11.5" (1.511 m)   Wt 128 lb (58.1 kg)   SpO2 98%   BMI 25.42 kg/m  Physical Exam Constitutional:      Appearance: Normal appearance.  Cardiovascular:     Rate and Rhythm: Normal rate and regular rhythm.  Pulmonary:     Effort: Pulmonary effort is normal.     Breath sounds: Normal breath sounds.  Skin:    Comments: Scaling and erythema of L plantar forefoot with peeling of skin.  Mild ttp.  No drainage or ulceration.    Neurological:     General: No focal deficit present.     Mental Status: She is alert.  Psychiatric:        Mood and Affect: Mood normal.        Behavior: Behavior normal.     ------------------------------------------------------------------------------------------------------------------------------------------------------------------------------------------------------------------- Assessment and  Plan  Tinea pedis Would favor fungal infection over bacterial cellulitis at this time.  Will treat with fluoconazole 150mg  weekly x4 weeks however discussed if symptoms were worsening please call back for re-evaluation.  She may use ibuprofen or acetaminophen as needed for pain control.   She and her mother express understanding of instructions.    This visit occurred during the SARS-CoV-2 public health emergency.  Safety protocols were in place, including screening questions prior to the visit, additional usage of staff PPE, and extensive cleaning of exam room while observing appropriate contact time as indicated for disinfecting solutions.

## 2019-12-05 NOTE — Patient Instructions (Signed)
Use fluconazole once weekly for the next 4 weeks.  Let us know if having any worsening symptoms including increased pain, redness, swelling or fever.

## 2019-12-05 NOTE — Assessment & Plan Note (Signed)
Would favor fungal infection over bacterial cellulitis at this time.  Will treat with fluoconazole 150mg  weekly x4 weeks however discussed if symptoms were worsening please call back for re-evaluation.  She may use ibuprofen or acetaminophen as needed for pain control.   She and her mother express understanding of instructions.

## 2020-10-05 ENCOUNTER — Other Ambulatory Visit: Payer: Self-pay | Admitting: Obstetrics & Gynecology

## 2020-10-05 DIAGNOSIS — N6320 Unspecified lump in the left breast, unspecified quadrant: Secondary | ICD-10-CM

## 2020-10-27 ENCOUNTER — Ambulatory Visit
Admission: RE | Admit: 2020-10-27 | Discharge: 2020-10-27 | Disposition: A | Discharge status: Home / Self Care | Payer: PRIVATE HEALTH INSURANCE | Source: Ambulatory Visit | Attending: Obstetrics & Gynecology | Admitting: Obstetrics & Gynecology

## 2020-10-27 ENCOUNTER — Other Ambulatory Visit: Payer: Self-pay

## 2020-10-27 DIAGNOSIS — N6320 Unspecified lump in the left breast, unspecified quadrant: Secondary | ICD-10-CM

## 2024-10-29 ENCOUNTER — Ambulatory Visit
Admission: RE | Admit: 2024-10-29 | Discharge: 2024-10-29 | Disposition: A | Discharge status: Home / Self Care | Payer: Self-pay | Source: Ambulatory Visit | Attending: Family Medicine | Admitting: Family Medicine

## 2024-10-29 ENCOUNTER — Ambulatory Visit: Payer: Self-pay

## 2024-10-29 VITALS — BP 102/70 | HR 103 | Temp 98.7°F | Resp 20 | Wt 136.6 lb

## 2024-10-29 DIAGNOSIS — J069 Acute upper respiratory infection, unspecified: Secondary | ICD-10-CM

## 2024-10-29 DIAGNOSIS — R0981 Nasal congestion: Secondary | ICD-10-CM

## 2024-10-29 LAB — POC COVID19/FLU A&B COMBO
Covid Antigen, POC: NEGATIVE
Influenza A Antigen, POC: NEGATIVE
Influenza B Antigen, POC: NEGATIVE

## 2024-10-29 LAB — POCT RAPID STREP A (OFFICE): Rapid Strep A Screen: NEGATIVE

## 2024-10-29 NOTE — Discharge Instructions (Addendum)
 You may also use over the counter AFRIN nasal spray. This medication is for use in the nose. Take it as directed on the label. Shake well before using. Do not use it more often than directed. Do not use for more than 3 days in a row without talking to your care team first. Make sure that you are using your nasal spray correctly.   Results for orders placed or performed during the hospital encounter of 10/29/24  POCT rapid strep A   Collection Time: 10/29/24  3:31 PM  Result Value Ref Range   Rapid Strep A Screen Negative Negative  POC Covid19/Flu A&B Antigen   Collection Time: 10/29/24  3:32 PM  Result Value Ref Range   Influenza A Antigen, POC Negative Negative   Influenza B Antigen, POC Negative Negative   Covid Antigen, POC Negative Negative

## 2024-10-29 NOTE — ED Triage Notes (Signed)
 Patient states 3 days of cough, congestion, sore throat.  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  Taking OTC Dayquil and Nyquil with little relief

## 2024-10-29 NOTE — ED Provider Notes (Signed)
 [facility]   [id number] 10/29/24 Arrival Time: 1454  ASSESSMENT & PLAN:  1. Viral URI   2. Nasal congestion    Discussed typical duration of likely viral illness. Results for orders placed or performed during the hospital encounter of 10/29/24  POCT rapid strep A   Collection Time: 10/29/24  3:31 PM  Result Value Ref Range   Rapid Strep A Screen Negative Negative  POC Covid19/Flu A&B Antigen   Collection Time: 10/29/24  3:32 PM  Result Value Ref Range   Influenza A Antigen, POC Negative Negative   Influenza B Antigen, POC Negative Negative   Covid Antigen, POC Negative Negative   OTC symptom care as needed.    Discharge Instructions      You may also use over the counter AFRIN nasal spray. This medication is for use in the nose. Take it as directed on the label. Shake well before using. Do not use it more often than directed. Do not use for more than 3 days in a row without talking to your care team first. Make sure that you are using your nasal spray correctly.   Results for orders placed or performed during the hospital encounter of 10/29/24  POCT rapid strep A   Collection Time: 10/29/24  3:31 PM  Result Value Ref Range   Rapid Strep A Screen Negative Negative  POC Covid19/Flu A&B Antigen   Collection Time: 10/29/24  3:32 PM  Result Value Ref Range   Influenza A Antigen, POC Negative Negative   Influenza B Antigen, POC Negative Negative   Covid Antigen, POC Negative Negative         Follow-up Information     [facility]).   Specialty: Urgent Care Why: As needed. Contact information: [address] [place]  [address] [phone]                Reviewed expectations re: course of current medical issues. Questions answered. Outlined signs and symptoms indicating need for more acute intervention. Understanding verbalized. After Visit Summary  given.   SUBJECTIVE: History from: Patient. Adriana Cain is a 38 y.o. female. Patient states 3 days of cough, congestion, sore throat.  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  Taking OTC Dayquil and Nyquil with little relief  Denies: fever. Normal PO intake without n/v/d.  OBJECTIVE:  Vitals:   10/29/24 1515 10/29/24 1518  BP: 102/70   Pulse: (!) 103   Resp: 20   Temp: 98.7 F (37.1 C)   TempSrc: Oral   SpO2: 97%   Weight:  62 kg    General appearance: alert; no distress Eyes: PERRLA; EOMI; conjunctiva normal HENT: [place]; AT; with nasal congestion Neck: supple  Lungs: speaks full sentences without difficulty; unlabored; clear Extremities: no edema Skin: warm and dry Neurologic: normal gait Psychological: alert and cooperative; normal mood and affect  Labs: Results for orders placed or performed during the hospital encounter of 10/29/24  POCT rapid strep A   Collection Time: 10/29/24  3:31 PM  Result Value Ref Range   Rapid Strep A Screen Negative Negative  POC Covid19/Flu A&B Antigen   Collection Time: 10/29/24  3:32 PM  Result Value Ref Range   Influenza A Antigen, POC Negative Negative   Influenza B Antigen, POC Negative Negative   Covid Antigen, POC Negative Negative   Labs Reviewed  POC COVID19/FLU A&B COMBO - Normal  POCT RAPID STREP A (OFFICE) -  Normal    Imaging: No results found.  Allergies  Allergen Reactions   Amoxicillin     REACTION: as child    History reviewed. No pertinent past medical history. Social History   Socioeconomic History   Marital status: Married    Spouse name: Not on file   Number of children: 0   Years of education: Not on file   Highest education level: Not on file  Occupational History   Occupation: Diet Aid at [employer]  Tobacco Use   Smoking status: Never   Smokeless tobacco: Never  Vaping Use   Vaping status: Never Used  Substance and Sexual Activity   Alcohol use:  No   Drug use: No   Sexual activity: Not on file  Other Topics Concern   Not on file  Social History Narrative   Married 9/08      Diet aid at [employer]   Social Drivers of Health   Financial Resource Strain: Not on file  Food Insecurity: Not on file  Transportation Needs: Not on file  Physical Activity: Not on file  Stress: Not on file  Social Connections: Not on file  Intimate Partner Violence: Not on file   Family History  Problem Relation Age of Onset   Alcohol abuse Mother    Alcohol abuse Father    Cancer Father        stomach   COPD Father    Alcohol abuse Brother    Diabetes Maternal Grandmother    Past Surgical History:  Procedure Laterality Date   TUBAL LIGATION Bilateral 2022     Adriana Rogue, MD 10/29/24 1610

## 2024-10-29 NOTE — Telephone Encounter (Signed)
 Returned call and reached mother Marval. Scheduled new patient appointment at preferred clinic with next available provider. Advised urgent care today for current symptoms of sore throat and sinus pain. Mother Marval verbalized understanding and will proceed with patient to urgent care today.    Copied from CRM [id number]. Topic: Clinical - Pink Word Triage >> Oct 29, 2024  9:33 AM Alfonso HERO wrote: Patient has a sore throat and sinus issues. No fever wants to get an appt some time today if possible with anyone

## 2024-12-17 NOTE — Telephone Encounter (Signed)
 Mother calling back in. She states the patient was seen at urgent care per RN's advise and is feeling much better. No triage/symptomatic complaints today. She states the patient just needs to reschedule her new patient appt from 12/30/24 due to she will have work that day. Moved appt to next soonest available, February 2026.

## 2024-12-30 ENCOUNTER — Ambulatory Visit: Payer: PRIVATE HEALTH INSURANCE

## 2025-01-21 ENCOUNTER — Encounter: Payer: Self-pay | Admitting: General Practice

## 2025-01-21 ENCOUNTER — Ambulatory Visit: Payer: PRIVATE HEALTH INSURANCE | Admitting: General Practice

## 2025-01-21 VITALS — BP 110/80 | HR 96 | Temp 98.2°F | Ht 61.0 in | Wt 133.0 lb

## 2025-01-21 DIAGNOSIS — Z124 Encounter for screening for malignant neoplasm of cervix: Secondary | ICD-10-CM

## 2025-01-21 DIAGNOSIS — Z0000 Encounter for general adult medical examination without abnormal findings: Secondary | ICD-10-CM | POA: Insufficient documentation

## 2025-01-21 DIAGNOSIS — L918 Other hypertrophic disorders of the skin: Secondary | ICD-10-CM | POA: Insufficient documentation

## 2025-01-21 DIAGNOSIS — Z1159 Encounter for screening for other viral diseases: Secondary | ICD-10-CM

## 2025-01-21 DIAGNOSIS — Z7689 Persons encountering health services in other specified circumstances: Secondary | ICD-10-CM | POA: Insufficient documentation

## 2025-01-21 DIAGNOSIS — Z114 Encounter for screening for human immunodeficiency virus [HIV]: Secondary | ICD-10-CM

## 2025-01-21 NOTE — Assessment & Plan Note (Signed)
 Immunizations tdap due- refused today; influenza vaccine UTD. Pap smear due- referral placed. Mammogram due, refused order today.  Discussed the importance of a healthy diet and regular exercise in order for weight loss, and to reduce the risk of further co-morbidity.  Exam stable. Labs pending.  Follow up in 1 year for repeat physical.

## 2025-01-21 NOTE — Progress Notes (Signed)
 "  New Patient Office Visit  Subjective    Patient ID: Adriana Cain, female    DOB: [birth year]  Age: 39 y.o. MRN: [id number]  CC:  Chief Complaint  Patient presents with   New Patient (Initial Visit)    Establish care   Skin Tag    On left calf x several years. May want to discuss getting rid of it a some point.     HPI Adriana Cain is a 39 y.o. female presents to establish care, complete physical and follow up of chronic conditions. Her mother is also present today.   Discussed the use of AI scribe software for clinical note transcription with the patient, who gave verbal consent to proceed.  History of Present Illness Adriana Cain is a 39 year old female who presents for a routine physical exam and evaluation of a skin tag on her left calf. She is accompanied by her mother, [name].  She has not seen a primary care provider in several years and has not had any recent blood work or physical exams. She is not currently on any medications.  She has a skin tag on her left calf, described as 'pretty big', which sometimes gets caught on her clothes.  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   Does not recall when her last tetanus shot was.  She tries to eat healthy and usually exercises, enjoying outdoor activities such as walking, jumping rope, and playing basketball. However, she has not been active recently due to ice and snow.     Immunizations: -Tetanus: due -Influenza: completed this season.  Diet: Fair diet.  Exercise: No regular exercise.  Eye exam: Completes annually  Dental exam: Completes semi-annually    Pap Smear: Completed in 2012 Mammogram: start in June.  Outpatient Encounter Medications as of 01/21/2025  Medication Sig   [DISCONTINUED] promethazine  (PHENERGAN ) 6.25 MG/5ML syrup Take 10 mLs (12.5 mg total) by mouth 4 (four) times daily as needed for nausea.   [DISCONTINUED] triamcinolone  (KENALOG ) 0.025 %  cream Apply 1 application topically 2 (two) times daily.   No facility-administered encounter medications on file as of 01/21/2025.    History reviewed. No pertinent past medical history.  Past Surgical History:  Procedure Laterality Date   TUBAL LIGATION Bilateral 2022    Family History  Problem Relation Age of Onset   Alcohol abuse Mother    Alcohol abuse Father    Cancer Father        stomach   COPD Father    Alcohol abuse Brother    Diabetes Maternal Grandmother     Social History   Socioeconomic History   Marital status: Married    Spouse name: Not on file   Number of children: 0   Years of education: Not on file   Highest education level: Not on file  Occupational History   Occupation: Diet Aid at [employer]  Tobacco Use   Smoking status: Never   Smokeless tobacco: Never  Vaping Use   Vaping status: Never Used  Substance and Sexual Activity   Alcohol use: No   Drug use: No   Sexual activity: Not on file  Other Topics Concern   Not on file  Social History Narrative   Married 9/08      Diet aid at [employer]   Social Drivers of Health   Tobacco Use: Low Risk (01/21/2025)   Patient History    Smoking Tobacco  Use: Never    Smokeless Tobacco Use: Never    Passive Exposure: Not on file  Financial Resource Strain: Not on file  Food Insecurity: Not on file  Transportation Needs: Not on file  Physical Activity: Not on file  Stress: Not on file  Social Connections: Not on file  Intimate Partner Violence: Not on file  Depression (PHQ2-9): Low Risk (01/21/2025)   Depression (PHQ2-9)    PHQ-2 Score: 1  Alcohol Screen: Not on file  Housing: Not on file  Utilities: Not on file  Health Literacy: Not on file    Review of Systems  Constitutional:  Negative for chills, fever, malaise/fatigue and weight loss.  HENT:  Negative for congestion, ear discharge, ear pain, hearing loss, nosebleeds, sinus pain, sore throat and tinnitus.   Eyes:  Negative  for blurred vision, double vision, pain, discharge and redness.  Respiratory:  Negative for cough, shortness of breath, wheezing and stridor.   Cardiovascular:  Negative for chest pain, palpitations and leg swelling.  Gastrointestinal:  Negative for abdominal pain, constipation, diarrhea, heartburn, nausea and vomiting.  Genitourinary:  Negative for dysuria, frequency and urgency.  Musculoskeletal:  Negative for myalgias.  Skin:  Negative for rash.  Neurological:  Negative for dizziness, tingling, seizures, weakness and headaches.  Endo/Heme/Allergies:  Negative for polydipsia.  Psychiatric/Behavioral:  Negative for depression, substance abuse and suicidal ideas. The patient is not nervous/anxious.         Objective    BP 110/80   Pulse 96   Temp 98.2 F (36.8 C) (Temporal)   Ht 5' 1 (1.549 m)   Wt 133 lb (60.3 kg)   SpO2 98%   BMI 25.13 kg/m   Physical Exam Vitals and nursing note reviewed.  Constitutional:      Appearance: Normal appearance.  HENT:     Head: Normocephalic and atraumatic.     Right Ear: Tympanic membrane, ear canal and external ear normal.     Left Ear: Tympanic membrane, ear canal and external ear normal.     Nose: Nose normal.     Mouth/Throat:     Mouth: Mucous membranes are moist.     Pharynx: Oropharynx is clear.  Eyes:     Conjunctiva/sclera: Conjunctivae normal.     Pupils: Pupils are equal, round, and reactive to light.  Cardiovascular:     Rate and Rhythm: Normal rate and regular rhythm.     Pulses: Normal pulses.     Heart sounds: Normal heart sounds.  Pulmonary:     Effort: Pulmonary effort is normal.     Breath sounds: Normal breath sounds.  Abdominal:     General: Abdomen is flat. Bowel sounds are normal.     Palpations: Abdomen is soft.  Musculoskeletal:        General: Normal range of motion.     Cervical back: Normal range of motion.  Skin:    General: Skin is warm and dry.     Capillary Refill: Capillary refill takes less  than 2 seconds.      Neurological:     General: No focal deficit present.     Mental Status: She is alert and oriented to person, place, and time. Mental status is at baseline.  Psychiatric:        Mood and Affect: Mood normal.        Behavior: Behavior normal.        Thought Content: Thought content normal.        Judgment: Judgment normal.  Assessment & Plan:  Skin tag -     Ambulatory referral to Dermatology  Establishing care with new doctor, encounter for Assessment & Plan: EMR reviewed briefly.     Screening for cervical cancer -     Ambulatory referral to Obstetrics / Gynecology  Encounter for screening and preventative care Assessment & Plan: Immunizations tdap due- refused today; influenza vaccine UTD. Pap smear due- referral placed. Mammogram due, refused order today.  Discussed the importance of a healthy diet and regular exercise in order for weight loss, and to reduce the risk of further co-morbidity.  Exam stable. Labs pending.  Follow up in 1 year for repeat physical.   Orders: -     CBC; Future -     Comprehensive metabolic panel with GFR; Future -     Hemoglobin A1c; Future -     TSH; Future -     Lipid panel; Future  Need for hepatitis C screening test -     Hepatitis C antibody; Future  Screening for HIV (human immunodeficiency virus) -     HIV Antibody (routine testing w rflx); Future    Assessment and Plan Assessment & Plan Skin tag, left inner thigh Large, bothersome skin tag on left inner thigh. Removal desired. - Referred to [facility] for evaluation and potential removal.    Return in about 1 year (around 01/21/2026) for physical and fasting labs.SABRA Carrol [place], NP  "

## 2025-01-21 NOTE — Patient Instructions (Addendum)
 Schedule lab ONLY appointment. Please come fasting for four prior to appointment. You are allowed water and black coffee.    You will either be contacted via phone regarding your referral to dermatology and gynecology , or you may receive a letter on your MyChart portal from our referral team with instructions for scheduling an appointment. Please let us  know if you have not been contacted by anyone within two weeks.   Call and let me know when you are ready to schedule mammogram in June.   Follow up in one year for physical.   It was a pleasure to meet you today! Please don't hesitate to contact me with any questions. Welcome to [facility]!

## 2025-01-21 NOTE — Assessment & Plan Note (Signed)
 EMR reviewed briefly.

## 2025-01-23 ENCOUNTER — Telehealth: Payer: Self-pay | Admitting: General Practice

## 2025-01-23 NOTE — Telephone Encounter (Signed)
 Copied from CRM #[id number]. Topic: Appointments - Appointment Scheduling >> Jan 23, 2025 10:24 AM Sophia H wrote: Mother states if no answer ok to schedule and leave voicemail

## 2025-01-23 NOTE — Telephone Encounter (Signed)
 Not sure what she is needing scheduled as she already has labs and other appts scheduled.

## 2025-01-28 ENCOUNTER — Other Ambulatory Visit: Payer: PRIVATE HEALTH INSURANCE

## 2026-01-18 ENCOUNTER — Other Ambulatory Visit: Payer: PRIVATE HEALTH INSURANCE

## 2026-01-25 ENCOUNTER — Encounter: Payer: PRIVATE HEALTH INSURANCE | Admitting: General Practice
